# Patient Record
Sex: Male | Born: 1945 | Race: White | Hispanic: No | Marital: Married | Smoking: Never smoker
Health system: Southern US, Community
[De-identification: ages and names within clinical notes are randomized; demographics above are authoritative.]

---

## 2017-05-11 ENCOUNTER — Other Ambulatory Visit: Payer: Self-pay | Admitting: Chiropractor

## 2017-05-11 DIAGNOSIS — M5412 Radiculopathy, cervical region: Secondary | ICD-10-CM

## 2017-05-21 ENCOUNTER — Ambulatory Visit
Admission: RE | Admit: 2017-05-21 | Discharge: 2017-05-21 | Disposition: A | Discharge status: Home / Self Care | Payer: BLUE CROSS/BLUE SHIELD | Source: Ambulatory Visit | Attending: Chiropractor | Admitting: Chiropractor

## 2017-05-21 DIAGNOSIS — M5412 Radiculopathy, cervical region: Secondary | ICD-10-CM

## 2018-10-27 IMAGING — MR MR CERVICAL SPINE W/O CM
4 of 5 series · 23 of 48 positions shown · non-contrast
Comparison: None.

CLINICAL DATA: Neck pain radiating to LEFT arm and LEFT thumb
numbness after fall 3 months ago. Remote history of surgery. Assess
radiculopathy.

EXAM:
MRI CERVICAL SPINE WITHOUT CONTRAST
TECHNIQUE: Multiplanar, multisequence MR imaging of the cervical spine was
performed. No intravenous contrast was administered.

[Series 4: T2 · sagittal · 3.3mm · 0.43mm/px · 6 of 14 slices shown (1 of 2)]
[im 1/14]
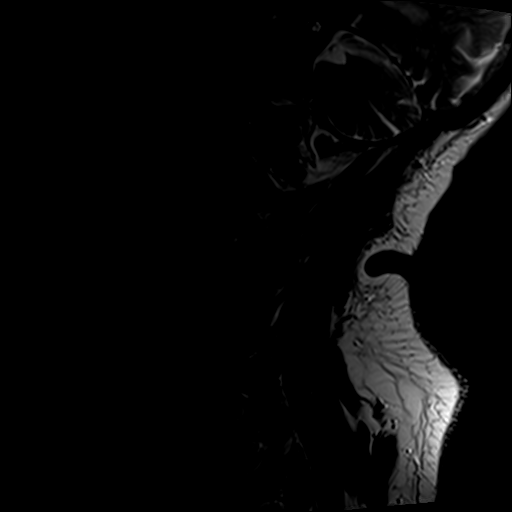
[im 3/14]
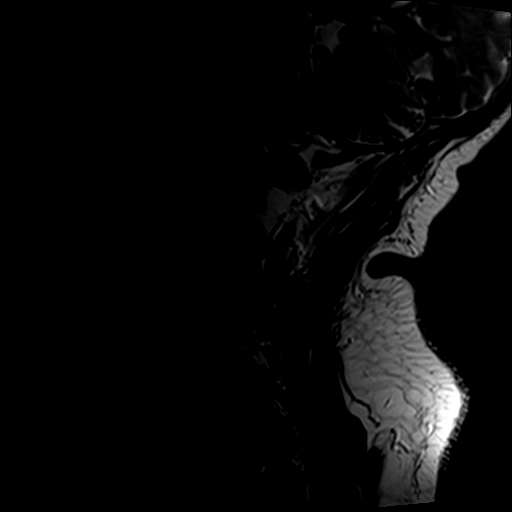
[im 6/14]
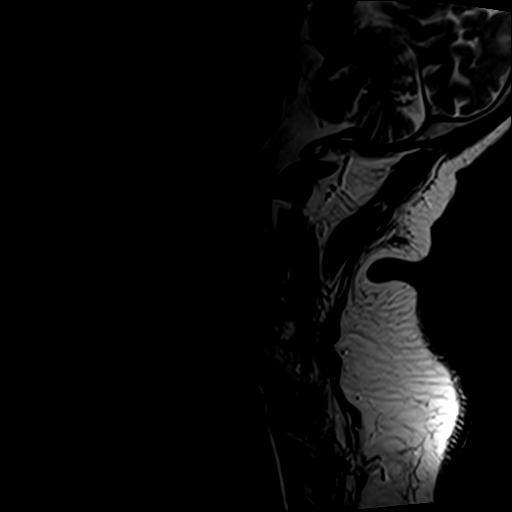
[im 8/14]
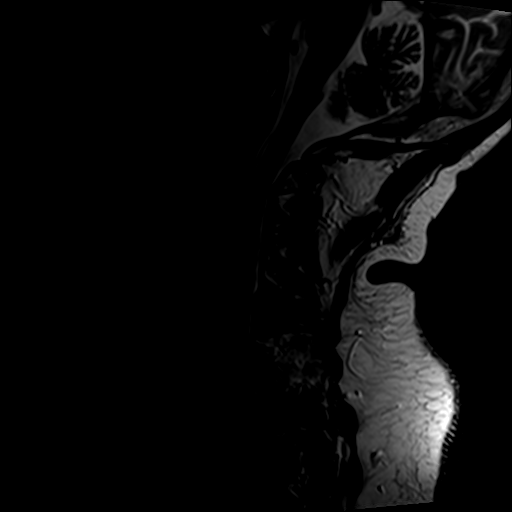
[im 11/14]
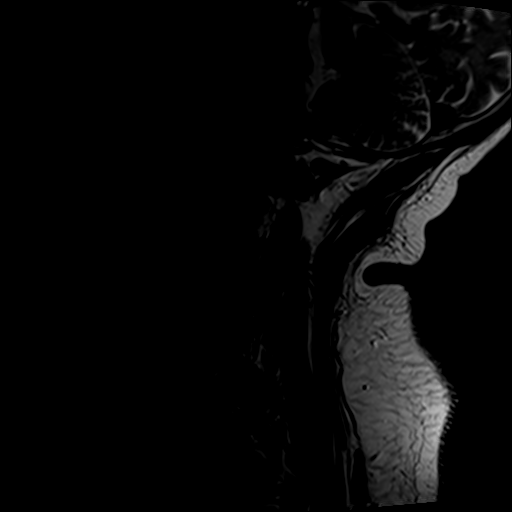
[im 14/14]
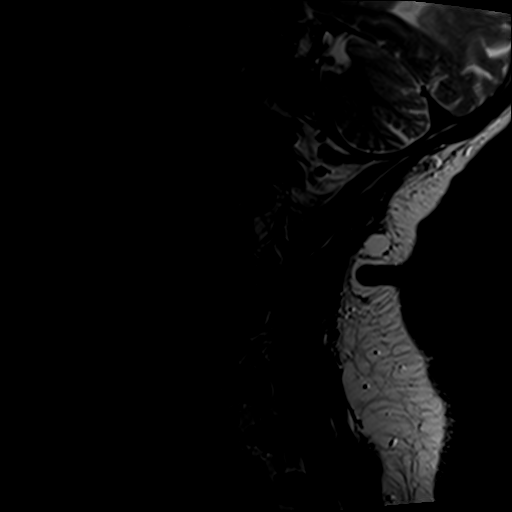

[Series 5: T1 · sagittal · 3.3mm · 0.43mm/px · 6 of 14 slices shown]
[im 1/14]
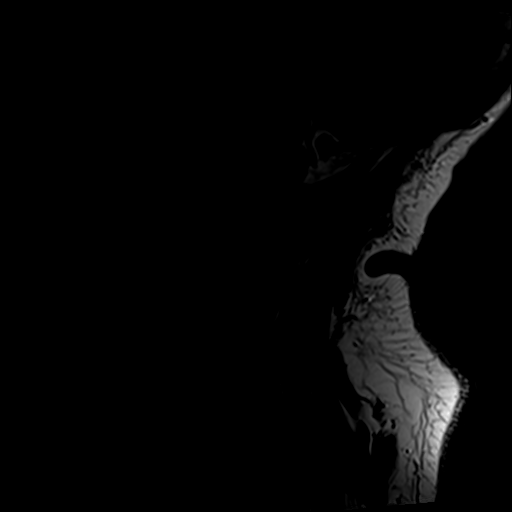
[im 3/14]
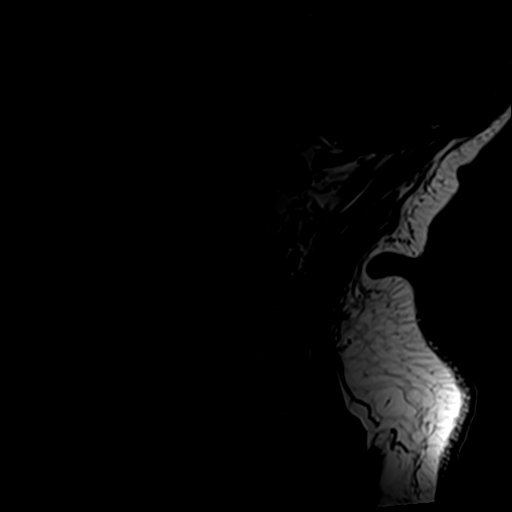
[im 5/14]
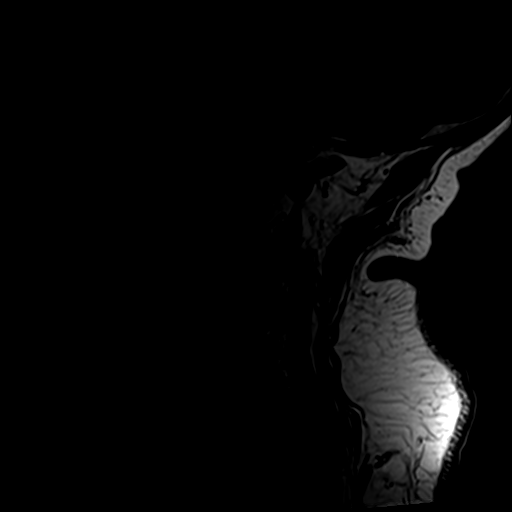
[im 7/14]
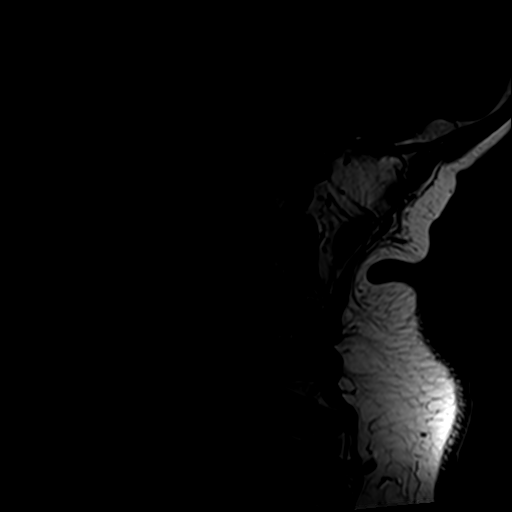
[im 9/14]
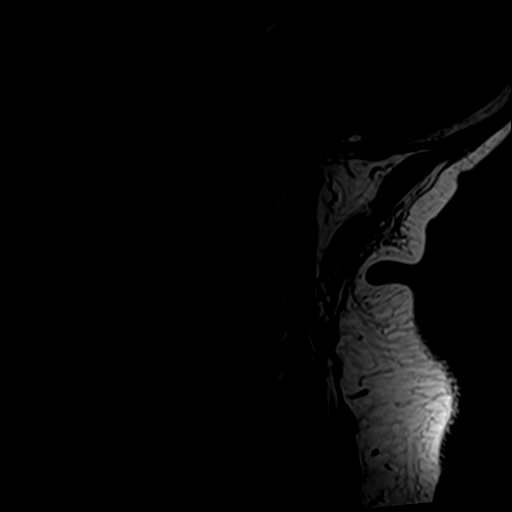
[im 11/14]
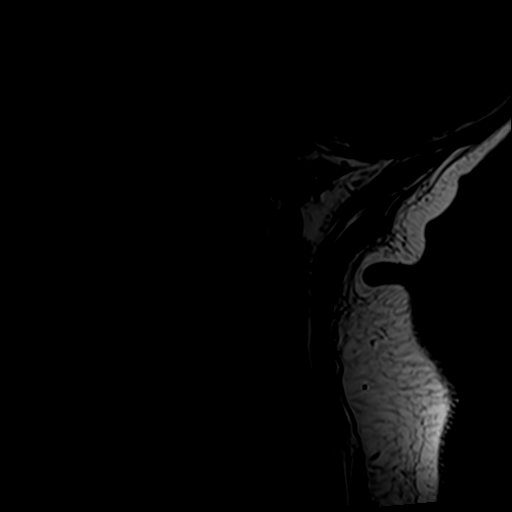

[Series 6: tir sag · sagittal · 3.3mm · 0.43mm/px · 3 of 14 slices shown]
[im 3/14]
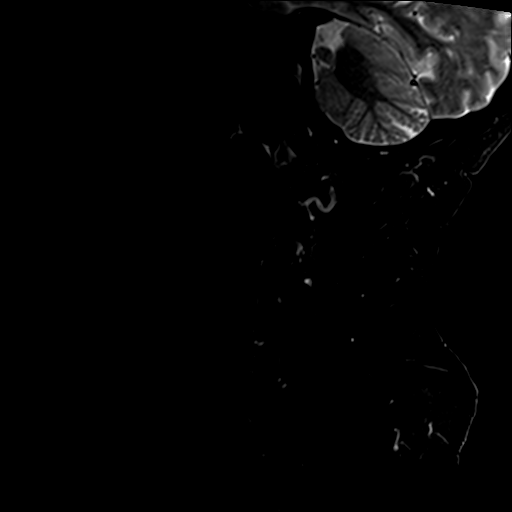
[im 7/14]
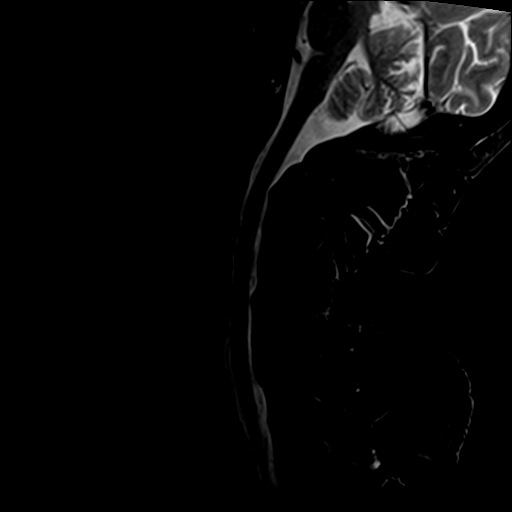
[im 11/14]
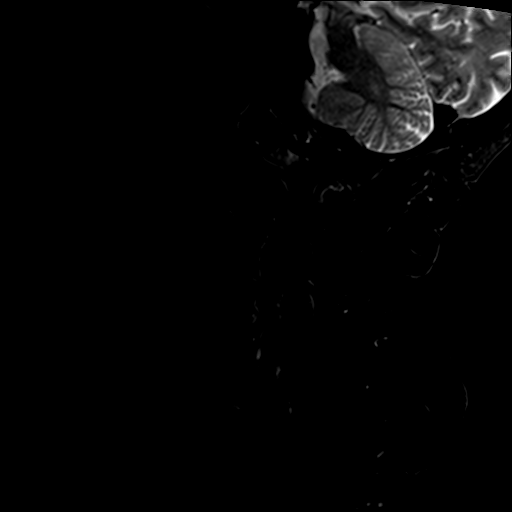

[Series 9: T2 · axial · 3.0mm · 0.70mm/px · z∈[-114,-20]mm · 8 of 28 slices shown (2 of 2)]
[im 1/28]
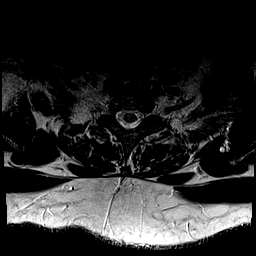
[im 5/28]
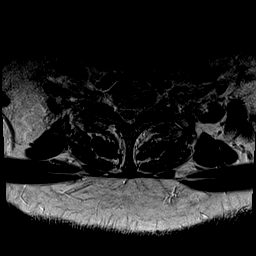
[im 9/28]
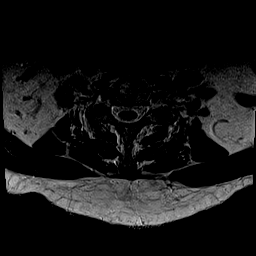
[im 13/28]
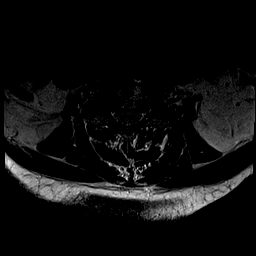
[im 15/28]
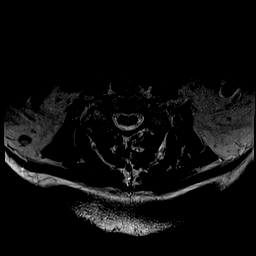
[im 19/28]
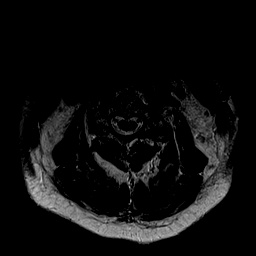
[im 23/28]
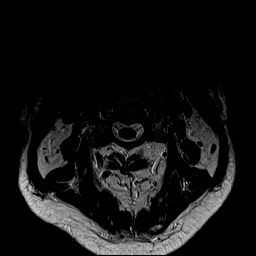
[im 28/28]
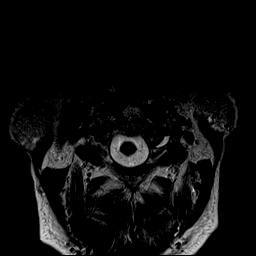

[23 of 48 positions shown; findings below may reference images not displayed]

FINDINGS: ALIGNMENT: Maintained cervical lordosis. Grade 1 C7-T1
anterolisthesis.

VERTEBRAE/DISCS: Solid C6-7 arthrodesis. Cervical vertebral bodies
intact. Moderate to severe C5-6 disc height loss with disc
desiccation and moderate chronic discogenic endplate changes
compatible with degenerative disc. Mild C7-T1 degenerative disc.
Mild low T1 and bright STIR signal bilateral C7-T1 facets compatible
with bone marrow edema and to lesser extent on the RIGHT C5-6 facet.

CORD:Cervical spinal cord is normal morphology and signal
characteristics from the cervicomedullary junction to level of T2-3,
the most caudal well visualized level.

POSTERIOR FOSSA, VERTEBRAL ARTERIES, PARASPINAL TISSUES: No MR
findings of ligamentous injury. Vertebral artery flow voids present.
Included posterior fossa and paraspinal soft tissues are normal.

DISC LEVELS:

C2-3: Small central disc protrusion. Severe RIGHT and mild LEFT
facet arthropathy. No canal stenosis. Moderate to severe RIGHT
neural foraminal narrowing.

C3-4: Small broad-based disc bulge and central disc protrusion
deforming the ventral spinal cord. Uncovertebral hypertrophy.
Moderate RIGHT and severe LEFT facet arthropathy. Mild canal
stenosis. Mild RIGHT. Moderate to severe LEFT neural foraminal
narrowing.

C4-5: Small central disc protrusion, uncovertebral hypertrophy.
Severe RIGHT and mild LEFT facet arthropathy. No canal stenosis.
Moderate to severe RIGHT neural foraminal narrowing.

C5-6: Small broad-based disc bulge, uncovertebral hypertrophy and
moderate facet arthropathy. Mild canal stenosis. Severe RIGHT,
moderate to severe LEFT neural foraminal narrowing.

C6-7: No disc bulge, canal stenosis nor neural foraminal narrowing.

C7-T1: Anterolisthesis. Annular bulging. Severe facet arthropathy.
Mild canal stenosis. Mild RIGHT, moderate LEFT neural foraminal
narrowing.
IMPRESSION: 1. C6-7 arthrodesis with findings of adjacent segment disease. Grade
1 C7-T1 anterolisthesis.
2. Degenerative change of the cervical spine including multilevel
advanced facet arthropathy with acute C7-T1 and to lesser extent
C5-6 reactive changes.
3. Mild canal stenosis C3-4, C5-6 and C7-T1. Multilevel neural
foraminal narrowing: Severe on the RIGHT at C5-6.

## 2022-05-12 ENCOUNTER — Emergency Department (HOSPITAL_COMMUNITY): Payer: BLUE CROSS/BLUE SHIELD

## 2022-05-12 ENCOUNTER — Other Ambulatory Visit: Payer: Self-pay

## 2022-05-12 ENCOUNTER — Observation Stay (HOSPITAL_COMMUNITY)
Admission: EM | Admit: 2022-05-12 | Discharge: 2022-05-13 | Disposition: A | Discharge status: Home / Self Care | Payer: BLUE CROSS/BLUE SHIELD | Attending: Internal Medicine | Admitting: Internal Medicine

## 2022-05-12 DIAGNOSIS — Z79624 Long term (current) use of inhibitors of nucleotide synthesis: Secondary | ICD-10-CM

## 2022-05-12 DIAGNOSIS — N400 Enlarged prostate without lower urinary tract symptoms: Secondary | ICD-10-CM | POA: Diagnosis present

## 2022-05-12 DIAGNOSIS — L309 Dermatitis, unspecified: Secondary | ICD-10-CM | POA: Diagnosis present

## 2022-05-12 DIAGNOSIS — G250 Essential tremor: Secondary | ICD-10-CM | POA: Diagnosis present

## 2022-05-12 DIAGNOSIS — Z7901 Long term (current) use of anticoagulants: Secondary | ICD-10-CM

## 2022-05-12 DIAGNOSIS — Z7985 Long-term (current) use of injectable non-insulin antidiabetic drugs: Secondary | ICD-10-CM

## 2022-05-12 DIAGNOSIS — I639 Cerebral infarction, unspecified: Principal | ICD-10-CM | POA: Insufficient documentation

## 2022-05-12 DIAGNOSIS — I6340 Cerebral infarction due to embolism of unspecified cerebral artery: Secondary | ICD-10-CM | POA: Diagnosis not present

## 2022-05-12 DIAGNOSIS — I482 Chronic atrial fibrillation, unspecified: Secondary | ICD-10-CM | POA: Diagnosis not present

## 2022-05-12 DIAGNOSIS — R29810 Facial weakness: Secondary | ICD-10-CM | POA: Diagnosis present

## 2022-05-12 DIAGNOSIS — Z880 Allergy status to penicillin: Secondary | ICD-10-CM

## 2022-05-12 DIAGNOSIS — E785 Hyperlipidemia, unspecified: Secondary | ICD-10-CM | POA: Insufficient documentation

## 2022-05-12 DIAGNOSIS — I10 Essential (primary) hypertension: Secondary | ICD-10-CM | POA: Insufficient documentation

## 2022-05-12 DIAGNOSIS — Z7982 Long term (current) use of aspirin: Secondary | ICD-10-CM

## 2022-05-12 DIAGNOSIS — R471 Dysarthria and anarthria: Secondary | ICD-10-CM | POA: Diagnosis present

## 2022-05-12 DIAGNOSIS — R29701 NIHSS score 1: Secondary | ICD-10-CM | POA: Diagnosis present

## 2022-05-12 DIAGNOSIS — E876 Hypokalemia: Secondary | ICD-10-CM | POA: Diagnosis present

## 2022-05-12 DIAGNOSIS — I160 Hypertensive urgency: Secondary | ICD-10-CM | POA: Diagnosis present

## 2022-05-12 DIAGNOSIS — E119 Type 2 diabetes mellitus without complications: Secondary | ICD-10-CM | POA: Insufficient documentation

## 2022-05-12 DIAGNOSIS — Z79899 Other long term (current) drug therapy: Secondary | ICD-10-CM

## 2022-05-12 LAB — CBC WITH DIFFERENTIAL/PLATELET
Abs Immature Granulocytes: 0.02 10*3/uL (ref 0.00–0.07)
Basophils Absolute: 0.1 10*3/uL (ref 0.0–0.1)
Basophils Relative: 1 %
Eosinophils Absolute: 0.2 10*3/uL (ref 0.0–0.5)
Eosinophils Relative: 3 %
HCT: 55 % — ABNORMAL HIGH (ref 39.0–52.0)
Hemoglobin: 18.2 g/dL — ABNORMAL HIGH (ref 13.0–17.0)
Immature Granulocytes: 0 %
Lymphocytes Relative: 24 %
Lymphs Abs: 2.1 10*3/uL (ref 0.7–4.0)
MCH: 30.4 pg (ref 26.0–34.0)
MCHC: 33.1 g/dL (ref 30.0–36.0)
MCV: 91.8 fL (ref 80.0–100.0)
Monocytes Absolute: 1 10*3/uL (ref 0.1–1.0)
Monocytes Relative: 11 %
Neutro Abs: 5.3 10*3/uL (ref 1.7–7.7)
Neutrophils Relative %: 61 %
Platelets: 269 10*3/uL (ref 150–400)
RBC: 5.99 MIL/uL — ABNORMAL HIGH (ref 4.22–5.81)
RDW: 12.4 % (ref 11.5–15.5)
WBC: 8.7 10*3/uL (ref 4.0–10.5)
nRBC: 0 % (ref 0.0–0.2)

## 2022-05-12 LAB — BASIC METABOLIC PANEL
Anion gap: 13 (ref 5–15)
BUN: 18 mg/dL (ref 8–23)
CO2: 30 mmol/L (ref 22–32)
Calcium: 9.4 mg/dL (ref 8.9–10.3)
Chloride: 95 mmol/L — ABNORMAL LOW (ref 98–111)
Creatinine, Ser: 1.11 mg/dL (ref 0.61–1.24)
GFR, Estimated: >60 mL/min (ref >60)
Glucose, Bld: 85 mg/dL (ref 70–99)
Potassium: 3.3 mmol/L — ABNORMAL LOW (ref 3.5–5.1)
Sodium: 138 mmol/L (ref 135–145)

## 2022-05-12 LAB — TROPONIN I (HIGH SENSITIVITY)
Troponin I (High Sensitivity): 14 ng/L (ref <18)
Troponin I (High Sensitivity): 14 ng/L (ref <18)

## 2022-05-12 LAB — CBG MONITORING, ED: Glucose-Capillary: 86 mg/dL (ref 70–99)

## 2022-05-12 NOTE — ED Provider Triage Note (Signed)
Emergency Medicine Provider Triage Evaluation Note  Ricky Juarez , a 77 y.o. male  was evaluated in triage.  Pt complains of facial droop.  Wife states symptoms began yesterday and that her husband started to veer to the right when he walks and is decree strength on his right side along with a facial droop.  Patient is on Eliquis.  Patient states had a headache for the past day.  Patient denied chest pain, shortness of breath, abdominal pain, nausea/vomiting, fevers, chills, dysuria  Review of Systems  Positive: See HPI Negative: See HPI  Physical Exam  There were no vitals taken for this visit. Gen:   Awake, no distress   Resp:  Normal effort  MSK:   Moves extremities without difficulty  Other:  Slightly asymmetric smile which wife says is new, 5 out of 5 bilateral grip/dorsiflexion, 2+ bilateral radial/posterior tibialis pulses with regular rate, sensation tact in all 4 limbs, patient able to blink  Medical Decision Making  Medically screening exam initiated at 6:25 PM.  Appropriate orders placed.  Ricky Juarez was informed that the remainder of the evaluation will be completed by another provider, this initial triage assessment does not replace that evaluation, and the importance of remaining in the ED until their evaluation is complete.  Workup initiated, patient stable at this time, patient's last known normal would be outside of tPA window and he is on Eliquis   Ricky Juarez 05/12/22 1835

## 2022-05-12 NOTE — ED Provider Notes (Signed)
[facility] Provider Note   CSN: [id number] Arrival date & time: 05/12/22  1823     History  Chief Complaint  Patient presents with   Facial Droop    Ricky Juarez is a 77 y.o. male.  Patient yesterday morning awoke at 8 in the morning had gone to bed at 10 PM the day before at 8 in the morning he noticed that he was offkilter and things were kind of leaning to the right side.  There was questionable facial droop.  Patient today has definite facial droop and dysarthria.  Yesterday patient felt that the leaning corrected itself but then it was back again today.  Denies any arm weakness or leg weakness or any numbness no visual changes.  But he felt that his speech was slurred today.  That seems to be improving but there seems to be a facial droop on the right side still.  Past medical history is significant for atrial fibrillation and patient is on Eliquis for that.  No prior history of stroke.  Past medical history sniffing for hypertension hyperlipidemia type 2 diabetes as well as the atrial fibrillation.  [facility]  Patient is never used tobacco products.       Home Medications Prior to Admission medications   Not on File      Allergies    Patient has no known allergies.    Review of Systems   Review of Systems  Constitutional:  Negative for chills and fever.  HENT:  Negative for ear pain and sore throat.   Eyes:  Negative for pain and visual disturbance.  Respiratory:  Negative for cough and shortness of breath.   Cardiovascular:  Negative for chest pain and palpitations.  Gastrointestinal:  Negative for abdominal pain and vomiting.  Genitourinary:  Negative for dysuria and hematuria.  Musculoskeletal:  Negative for arthralgias and back pain.  Skin:  Negative for color change and rash.  Neurological:  Positive for facial asymmetry and speech difficulty. Negative for seizures, syncope and headaches.  All other  systems reviewed and are negative.   Physical Exam Updated Vital Signs BP (!) 196/120   Pulse (!) 103   Temp 98 F (36.7 C) (Oral)   Resp (!) 26   SpO2 97%  Physical Exam Vitals and nursing note reviewed.  Constitutional:      General: He is not in acute distress.    Appearance: Normal appearance. He is well-developed.  HENT:     Head: Normocephalic and atraumatic.  Eyes:     Extraocular Movements: Extraocular movements intact.     Conjunctiva/sclera: Conjunctivae normal.     Pupils: Pupils are equal, round, and reactive to light.  Cardiovascular:     Rate and Rhythm: Normal rate and regular rhythm.     Heart sounds: No murmur heard. Pulmonary:     Effort: Pulmonary effort is normal. No respiratory distress.     Breath sounds: Normal breath sounds.  Abdominal:     Palpations: Abdomen is soft.     Tenderness: There is no abdominal tenderness.  Musculoskeletal:        General: No swelling.     Cervical back: Normal range of motion and neck supple.  Skin:    General: Skin is warm and dry.     Capillary Refill: Capillary refill takes less than 2 seconds.  Neurological:     Mental Status: He is alert.     Cranial Nerves: Cranial nerve  deficit present.     Sensory: No sensory deficit.     Motor: No weakness.     Coordination: Coordination normal.     Comments: Gait not tested.  Patient has right facial droop.  Psychiatric:        Mood and Affect: Mood normal.     ED Results / Procedures / Treatments   Labs (all labs ordered are listed, but only abnormal results are displayed) Labs Reviewed  BASIC METABOLIC PANEL - Abnormal; Notable for the following components:      Result Value   Potassium 3.3 (*)    Chloride 95 (*)    All other components within normal limits  CBC WITH DIFFERENTIAL/PLATELET - Abnormal; Notable for the following components:   RBC 5.99 (*)    Hemoglobin 18.2 (*)    HCT 55.0 (*)    All other components within normal limits  CBG MONITORING, ED   TROPONIN I (HIGH SENSITIVITY)  TROPONIN I (HIGH SENSITIVITY)    EKG EKG Interpretation  Date/Time:  Monday May 12 2022 19:26:52 EDT Ventricular Rate:  92 PR Interval:    QRS Duration: 105 QT Interval:  405 QTC Calculation: 502 R Axis:   41 Text Interpretation: Atrial fibrillation Ventricular premature complex Borderline repolarization abnormality Prolonged QT interval No previous ECGs available Confirmed by Fredia Sorrow [phone]) on 05/12/2022 7:35:40 PM  Radiology CT Head Wo Contrast  Result Date: 05/12/2022 CLINICAL DATA:  Right facial droop, weakness EXAM: CT HEAD WITHOUT CONTRAST TECHNIQUE: Contiguous axial images were obtained from the base of the skull through the vertex without intravenous contrast. RADIATION DOSE REDUCTION: This exam was performed according to the departmental dose-optimization program which includes automated exposure control, adjustment of the mA and/or kV according to patient size and/or use of iterative reconstruction technique. COMPARISON:  None Available. FINDINGS: Brain: Normal anatomic configuration. Parenchymal volume loss is commensurate with the patient's age. Mild periventricular white matter changes are present likely reflecting the sequela of small vessel ischemia. No abnormal intra or extra-axial mass lesion or fluid collection. No abnormal mass effect or midline shift. No evidence of acute intracranial hemorrhage or infarct. Ventricular size is normal. Cerebellum unremarkable. Vascular: No asymmetric hyperdense vasculature at the skull base. Skull: Intact Sinuses/Orbits: Paranasal sinuses are clear. Orbits are unremarkable. Other: Mastoid air cells and middle ear cavities are clear. IMPRESSION: 1. No acute intracranial hemorrhage or infarct. 2. Mild senescent change. Electronically Signed   By: Fidela Salisbury M.D.   On: 05/12/2022 19:46    Procedures Procedures    Medications Ordered in ED Medications - No data to display  ED Course/ Medical  Decision Making/ A&P                             Medical Decision Making Amount and/or Complexity of Data Reviewed Radiology: ordered.  Risk Decision regarding hospitalization.   CRITICAL CARE Performed by: Fredia Sorrow Total critical care time: 45 minutes Critical care time was exclusive of separately billable procedures and treating other patients. Critical care was necessary to treat or prevent imminent or life-threatening deterioration. Critical care was time spent personally by me on the following activities: development of treatment plan with patient and/or surrogate as well as nursing, discussions with consultants, evaluation of patient's response to treatment, examination of patient, obtaining history from patient or surrogate, ordering and performing treatments and interventions, ordering and review of laboratory studies, ordering and review of radiographic studies, pulse oximetry and re-evaluation of  patient's condition.  Discussed with Dr. Leonel Ramsay.  CT head without acute findings.  Patient still seems to have some facial droop but he thinks this may just be more TIA and since this started actually almost 2 days ago may not require admission.  He wants to do CTA angio head and neck and MRI brain and if those are all very normal then patient could be discharged home would recommend contacting him once results are back.  Labs here troponin 14 blood sugar 86.  Basic metabolic panel significant for potassium 3.3's with some mild hypokalemia.  Renal function normal.  CBC white count 8.7 hemoglobin 18.2 platelets are normal at 269.  CT head does mention no acute findings.    Final Clinical Impression(s) / ED Diagnoses Final diagnoses:  Cerebrovascular accident (CVA), unspecified mechanism [facility])    Rx / DC Orders ED Discharge Orders     None         Fredia Sorrow, MD 05/12/22 2329

## 2022-05-12 NOTE — ED Triage Notes (Signed)
Patient here with complaint of facial droop and dysarthria that started yesterday per wife. Patient states he thinks the symptoms may be related to starting a beta blocker two weeks ago. No arm or leg drift, no sensation loss.

## 2022-05-12 NOTE — ED Notes (Signed)
Informed RN [facility] pt monitor indicated AFIB (non lethal)

## 2022-05-12 NOTE — ED Notes (Signed)
Patient transported to CT 

## 2022-05-13 ENCOUNTER — Encounter (HOSPITAL_COMMUNITY): Payer: Self-pay | Admitting: Internal Medicine

## 2022-05-13 ENCOUNTER — Other Ambulatory Visit (HOSPITAL_COMMUNITY): Payer: Self-pay

## 2022-05-13 ENCOUNTER — Inpatient Hospital Stay (HOSPITAL_COMMUNITY): Payer: BLUE CROSS/BLUE SHIELD

## 2022-05-13 ENCOUNTER — Other Ambulatory Visit: Payer: Self-pay

## 2022-05-13 DIAGNOSIS — I482 Chronic atrial fibrillation, unspecified: Secondary | ICD-10-CM | POA: Diagnosis present

## 2022-05-13 DIAGNOSIS — I160 Hypertensive urgency: Secondary | ICD-10-CM | POA: Diagnosis present

## 2022-05-13 DIAGNOSIS — I480 Paroxysmal atrial fibrillation: Secondary | ICD-10-CM

## 2022-05-13 DIAGNOSIS — E785 Hyperlipidemia, unspecified: Secondary | ICD-10-CM | POA: Diagnosis present

## 2022-05-13 DIAGNOSIS — I6389 Other cerebral infarction: Secondary | ICD-10-CM | POA: Diagnosis not present

## 2022-05-13 DIAGNOSIS — Z79624 Long term (current) use of inhibitors of nucleotide synthesis: Secondary | ICD-10-CM | POA: Diagnosis not present

## 2022-05-13 DIAGNOSIS — E119 Type 2 diabetes mellitus without complications: Secondary | ICD-10-CM | POA: Diagnosis present

## 2022-05-13 DIAGNOSIS — R29701 NIHSS score 1: Secondary | ICD-10-CM | POA: Diagnosis present

## 2022-05-13 DIAGNOSIS — Z7901 Long term (current) use of anticoagulants: Secondary | ICD-10-CM | POA: Diagnosis not present

## 2022-05-13 DIAGNOSIS — I639 Cerebral infarction, unspecified: Secondary | ICD-10-CM | POA: Diagnosis not present

## 2022-05-13 DIAGNOSIS — Z79899 Other long term (current) drug therapy: Secondary | ICD-10-CM | POA: Diagnosis not present

## 2022-05-13 DIAGNOSIS — Z880 Allergy status to penicillin: Secondary | ICD-10-CM | POA: Diagnosis not present

## 2022-05-13 DIAGNOSIS — G250 Essential tremor: Secondary | ICD-10-CM | POA: Diagnosis present

## 2022-05-13 DIAGNOSIS — Z7982 Long term (current) use of aspirin: Secondary | ICD-10-CM | POA: Diagnosis not present

## 2022-05-13 DIAGNOSIS — E876 Hypokalemia: Secondary | ICD-10-CM | POA: Diagnosis present

## 2022-05-13 DIAGNOSIS — I10 Essential (primary) hypertension: Secondary | ICD-10-CM | POA: Diagnosis present

## 2022-05-13 DIAGNOSIS — I6340 Cerebral infarction due to embolism of unspecified cerebral artery: Secondary | ICD-10-CM | POA: Diagnosis present

## 2022-05-13 DIAGNOSIS — R471 Dysarthria and anarthria: Secondary | ICD-10-CM | POA: Diagnosis present

## 2022-05-13 DIAGNOSIS — Z7985 Long-term (current) use of injectable non-insulin antidiabetic drugs: Secondary | ICD-10-CM | POA: Diagnosis not present

## 2022-05-13 DIAGNOSIS — R29810 Facial weakness: Secondary | ICD-10-CM | POA: Diagnosis present

## 2022-05-13 DIAGNOSIS — L309 Dermatitis, unspecified: Secondary | ICD-10-CM | POA: Diagnosis present

## 2022-05-13 DIAGNOSIS — N400 Enlarged prostate without lower urinary tract symptoms: Secondary | ICD-10-CM | POA: Diagnosis present

## 2022-05-13 LAB — LIPID PANEL
Cholesterol: 175 mg/dL (ref 0–200)
HDL: 54 mg/dL (ref >40)
LDL Cholesterol: 111 mg/dL — ABNORMAL HIGH (ref 0–99)
Total CHOL/HDL Ratio: 3.2 RATIO
Triglycerides: 49 mg/dL (ref <150)
VLDL: 10 mg/dL (ref 0–40)

## 2022-05-13 LAB — ECHOCARDIOGRAM COMPLETE BUBBLE STUDY
AR max vel: 2.85 cm2
AV Area VTI: 2.48 cm2
AV Area mean vel: 2.53 cm2
AV Mean grad: 3 mmHg
AV Peak grad: 5.4 mmHg
Ao pk vel: 1.17 m/s

## 2022-05-13 LAB — HEMOGLOBIN A1C
Hgb A1c MFr Bld: 6.2 % — ABNORMAL HIGH (ref 4.8–5.6)
Mean Plasma Glucose: 131 mg/dL

## 2022-05-13 MED ORDER — ATORVASTATIN CALCIUM 10 MG PO TABS: 20.0000 mg | ORAL_TABLET | Freq: Every day | ORAL | Status: DC

## 2022-05-13 MED ORDER — ATORVASTATIN CALCIUM 10 MG PO TABS
10.0000 mg | ORAL_TABLET | Freq: Every day | ORAL | Status: DC
  Administered 2022-05-13: 10 mg via ORAL
  Filled 2022-05-13: qty 1

## 2022-05-13 MED ORDER — ATORVASTATIN CALCIUM 40 MG PO TABS
40.0000 mg | ORAL_TABLET | Freq: Every day | ORAL | 3 refills | Status: AC
  Filled 2022-05-13: qty 30, 30d supply, fill #0

## 2022-05-13 MED ORDER — TORSEMIDE 20 MG PO TABS: 10.0000 mg | ORAL_TABLET | Freq: Every day | ORAL | Status: DC | PRN

## 2022-05-13 MED ORDER — IOHEXOL 350 MG/ML SOLN
75.0000 mL | Freq: Once | INTRAVENOUS | Status: AC | PRN
  Administered 2022-05-13: 75 mL via INTRAVENOUS

## 2022-05-13 MED ORDER — DILTIAZEM HCL ER COATED BEADS 180 MG PO CP24
360.0000 mg | ORAL_CAPSULE | Freq: Every day | ORAL | Status: DC
  Administered 2022-05-13: 360 mg via ORAL
  Filled 2022-05-13: qty 2

## 2022-05-13 MED ORDER — TAMSULOSIN HCL 0.4 MG PO CAPS: 0.4000 mg | ORAL_CAPSULE | Freq: Every evening | ORAL | Status: DC

## 2022-05-13 MED ORDER — STROKE: EARLY STAGES OF RECOVERY BOOK: Freq: Once | Status: DC

## 2022-05-13 MED ORDER — SENNOSIDES-DOCUSATE SODIUM 8.6-50 MG PO TABS: 1.0000 | ORAL_TABLET | Freq: Every evening | ORAL | Status: DC | PRN

## 2022-05-13 MED ORDER — ASPIRIN 81 MG PO TBEC
81.0000 mg | DELAYED_RELEASE_TABLET | Freq: Every day | ORAL | 12 refills | Status: AC
  Filled 2022-05-13: qty 30, 30d supply, fill #0

## 2022-05-13 MED ORDER — APIXABAN 5 MG PO TABS: 5.0000 mg | ORAL_TABLET | Freq: Two times a day (BID) | ORAL | Status: DC

## 2022-05-13 MED ORDER — ACETAMINOPHEN 325 MG PO TABS: 650.0000 mg | ORAL_TABLET | ORAL | Status: DC | PRN

## 2022-05-13 MED ORDER — APIXABAN 5 MG PO TABS
5.0000 mg | ORAL_TABLET | Freq: Two times a day (BID) | ORAL | Status: DC
  Administered 2022-05-13: 5 mg via ORAL
  Filled 2022-05-13: qty 1

## 2022-05-13 MED ORDER — ACETAMINOPHEN 650 MG RE SUPP: 650.0000 mg | RECTAL | Status: DC | PRN

## 2022-05-13 MED ORDER — SODIUM CHLORIDE 0.9 % IV SOLN: INTRAVENOUS | Status: DC

## 2022-05-13 MED ORDER — ACETAMINOPHEN 160 MG/5ML PO SOLN: 650.0000 mg | ORAL | Status: DC | PRN

## 2022-05-13 MED ORDER — METOPROLOL SUCCINATE ER 25 MG PO TB24
25.0000 mg | ORAL_TABLET | Freq: Every day | ORAL | Status: DC
  Filled 2022-05-13: qty 1

## 2022-05-13 MED ORDER — ASPIRIN 81 MG PO TBEC
81.0000 mg | DELAYED_RELEASE_TABLET | Freq: Every day | ORAL | Status: DC
  Administered 2022-05-13: 81 mg via ORAL
  Filled 2022-05-13: qty 1

## 2022-05-13 MED ORDER — ATORVASTATIN CALCIUM 40 MG PO TABS: 40.0000 mg | ORAL_TABLET | Freq: Every day | ORAL | Status: DC

## 2022-05-13 NOTE — H&P (Signed)
History and Physical    Ricky Juarez [id number] DOB: [birth year] DOA: 05/12/2022  PCP: Nickola Major, MD  Patient coming from: home  I have personally briefly reviewed patient's old medical records in [place]  Chief Complaint:  slurred speech and weakness  HPI: Ricky Juarez is a 77 y.o. male with medical history significant of   Hypertension, DMII, Vit D def,  Atrial  fib s/p 2 trials at ablation , OSA , BPH ,  essential tremor HLD who presents with  facial droop on the right and slurred speech  and feeling off balanced which started 24 hour prior to presentation. Patient currently states he presented to  ED at urging of wife and son as is symptoms returned from day prior and now was having difficulty with his speech and facial droop.He notes symptoms have resolved. He notes no HA, presyncope, no prior episodes like this in the past.  ED Course:  Vitals: afeb, bp 192/126,  hr 16 sat 99%  Labs wbc 8.7, hgb 18.2 , plt 269  N a138, K 3.3 , cr 1.1, ce 14  CTHMPRESSION: 1. No acute intracranial hemorrhage or infarct. 2. Mild senescent change.   MRI MPRESSION: 1. 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 2. Underlying age-related cerebral atrophy with moderate chronic microvascular ischemic disease.  CTA neck : Neg for LVO Cxr NAD Review of Systems: As per HPI otherwise 10 point review of systems negative.   PMH As noted above     SH  No tobacco  No etoh  quit 55mo ago  Allergies  Allergen Reactions   Penicillin G Anaphylaxis and Rash    No family history on file.  Prior to Admission medications   Medication Sig Start Date End Date Taking? Authorizing Provider  atorvastatin (LIPITOR) 10 MG tablet Take 10 mg by mouth daily. 02/02/22  Yes [provider]  clobetasol cream (TEMOVATE) [id number] % Apply 1 Application topically daily as needed (for rash). 04/02/22  Yes [provider]  desonide (DESOWEN)  0.05 % cream Apply 1 Application topically daily as needed (for rash). 04/02/22  Yes [provider]  diltiazem (CARDIZEM CD) 360 MG 24 hr capsule Take 360 mg by mouth daily. 02/02/22  Yes [provider]  ELIQUIS 5 MG TABS tablet Take 5 mg by mouth 2 (two) times daily. 10/29/21  Yes [provider]  losartan-hydrochlorothiazide (HYZAAR) 100-25 MG tablet Take 1 tablet by mouth daily. 04/02/22  Yes [provider]  metoprolol succinate (TOPROL-XL) 25 MG 24 hr tablet Take 25 mg by mouth daily. 04/28/22 04/28/23 Yes [provider]  MOUNJARO 2.5 MG/0.5ML Pen Inject 2.5 mg into the skin once a week. Then increase to 5mg  weekly (new separate prescription sent) - Saturday 04/30/22  Yes [provider]  mycophenolate (CELLCEPT) 250 MG capsule Take 250 mg by mouth in the morning, at noon, and at bedtime. For rash   Yes [provider]  primidone (MYSOLINE) 50 MG tablet Take 50-100 mg by mouth See admin instructions. Take 1 tablet by mouth 1 night and alternate with 2 tablets the next night 02/10/22  Yes [provider]  tamsulosin (FLOMAX) 0.4 MG CAPS capsule Take 0.4 mg by mouth every evening. 11/23/21  Yes [provider]  torsemide (DEMADEX) 10 MG tablet Take 10 mg by mouth daily as needed (for swelling). 03/25/22  Yes [provider]  diltiazem (CARDIZEM) 30 MG tablet Take 30 mg by mouth daily as needed (  for BP >170). Patient not taking: Reported on 05/12/2022 01/07/21   [provider]    Physical Exam: Vitals:   05/13/22 0130 05/13/22 0139 05/13/22 0322 05/13/22 0330  BP: (!) 178/100  (!) 188/126 (!) 182/94  Pulse: 87  87 86  Resp: (!) 23  18 19   Temp:  98 F (36.7 C)    TempSrc:  Oral    SpO2: 94%  95% 96%    Constitutional: NAD, calm, comfortable Vitals:   05/13/22 0130 05/13/22 0139 05/13/22 0322 05/13/22 0330  BP: (!) 178/100  (!) 188/126 (!) 182/94  Pulse: 87  87 86  Resp: (!) 23  18 19   Temp:  98  F (36.7 C)    TempSrc:  Oral    SpO2: 94%  95% 96%   Eyes: PERRL, lids and conjunctivae normal ENMT: Mucous membranes are moist. Posterior pharynx clear of any exudate or lesions.Normal dentition.  Neck: normal, supple, no masses, no thyromegaly Respiratory: clear to auscultation bilaterally, no wheezing, no crackles. Normal respiratory effort. No accessory muscle use.  Cardiovascular: Regular rate and rhythm, no murmurs / rubs / gallops. No extremity edema. 2+ pedal pulses. No carotid bruits.  Abdomen: no tenderness, no masses palpated. No hepatosplenomegaly. Bowel sounds positive.  Musculoskeletal: no clubbing / cyanosis. No joint deformity upper and lower extremities. Good ROM, no contractures. Normal muscle tone.  Skin: no rashes, lesions, ulcers. No induration Neurologic: CN 2-12 grossly intact except for mild facial drop on right Sensation intact,  Strength 5/5 in all 4.  Psychiatric: Normal judgment and insight. Alert and oriented x 3. Normal mood.    Labs on Admission: I have personally reviewed following labs and imaging studies  CBC: Recent Labs  Lab 05/12/22 1844  WBC 8.7  NEUTROABS 5.3  HGB 18.2*  HCT 55.0*  MCV 91.8  PLT [id number]   Basic Metabolic Panel: Recent Labs  Lab 05/12/22 1844  NA 138  K 3.3*  CL 95*  CO2 30  GLUCOSE 85  BUN 18  CREATININE 1.11  CALCIUM 9.4   GFR: CrCl cannot be calculated (Unknown ideal weight.). Liver Function Tests: No results for input(s): "AST", "ALT", "ALKPHOS", "BILITOT", "PROT", "ALBUMIN" in the last 168 hours. No results for input(s): "LIPASE", "AMYLASE" in the last 168 hours. No results for input(s): "AMMONIA" in the last 168 hours. Coagulation Profile: No results for input(s): "INR", "PROTIME" in the last 168 hours. Cardiac Enzymes: No results for input(s): "CKTOTAL", "CKMB", "CKMBINDEX", "TROPONINI" in the last 168 hours. BNP (last 3 results) No results for input(s): "PROBNP" in the last 8760 hours. HbA1C: No  results for input(s): "HGBA1C" in the last 72 hours. CBG: Recent Labs  Lab 05/12/22 1926  GLUCAP 86   Lipid Profile: No results for input(s): "CHOL", "HDL", "LDLCALC", "TRIG", "CHOLHDL", "LDLDIRECT" in the last 72 hours. Thyroid Function Tests: No results for input(s): "TSH", "T4TOTAL", "FREET4", "T3FREE", "THYROIDAB" in the last 72 hours. Anemia Panel: No results for input(s): "VITAMINB12", "FOLATE", "FERRITIN", "TIBC", "IRON", "RETICCTPCT" in the last 72 hours. Urine analysis: No results found for: "COLORURINE", "APPEARANCEUR", "LABSPEC", "PHURINE", "GLUCOSEU", "HGBUR", "BILIRUBINUR", "KETONESUR", "PROTEINUR", "UROBILINOGEN", "NITRITE", "LEUKOCYTESUR"  Radiological Exams on Admission: MR Brain Wo Contrast (neuro protocol)  Result Date: 05/13/2022 CLINICAL DATA:  Initial evaluation for neuro deficit, stroke. EXAM: MRI HEAD WITHOUT CONTRAST TECHNIQUE: Multiplanar, multiecho pulse sequences of the brain and surrounding structures were obtained without intravenous contrast. COMPARISON:  Prior CT from 05/12/2022. FINDINGS: Brain: Generalized age-related cerebral atrophy. Patchy T2/FLAIR hyperintensity involving the periventricular and  deep white matter both cerebral hemispheres, consistent with chronic small vessel ischemic disease, moderate in nature. Patchy small volume restricted diffusion involving the left basal ganglia/corona radiata, consistent with acute ischemic infarct (series 5, image 76). No associated hemorrhage or mass effect. No other evidence for acute or subacute ischemia. Gray-white matter differentiation otherwise maintained. No areas of chronic cortical infarction. No acute intracranial hemorrhage. Single punctate chronic microhemorrhage noted within the right cerebellum. No mass lesion, midline shift or mass effect. No hydrocephalus or extra-axial fluid collection. Pituitary gland and suprasellar region within normal limits. Vascular: Major intracranial vascular flow voids are  maintained. Skull and upper cervical spine: Craniocervical junction within normal limits. Bone marrow signal intensity normal. No scalp soft tissue abnormality. Sinuses/Orbits: Prior bilateral ocular lens replacement. Paranasal sinuses are largely clear. No mastoid effusion. Other: None. IMPRESSION: 1. 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 2. Underlying age-related cerebral atrophy with moderate chronic microvascular ischemic disease. Electronically Signed   By: Jeannine Boga M.D.   On: 05/13/2022 03:02   CT Head Wo Contrast  Result Date: 05/12/2022 CLINICAL DATA:  Right facial droop, weakness EXAM: CT HEAD WITHOUT CONTRAST TECHNIQUE: Contiguous axial images were obtained from the base of the skull through the vertex without intravenous contrast. RADIATION DOSE REDUCTION: This exam was performed according to the departmental dose-optimization program which includes automated exposure control, adjustment of the mA and/or kV according to patient size and/or use of iterative reconstruction technique. COMPARISON:  None Available. FINDINGS: Brain: Normal anatomic configuration. Parenchymal volume loss is commensurate with the patient's age. Mild periventricular white matter changes are present likely reflecting the sequela of small vessel ischemia. No abnormal intra or extra-axial mass lesion or fluid collection. No abnormal mass effect or midline shift. No evidence of acute intracranial hemorrhage or infarct. Ventricular size is normal. Cerebellum unremarkable. Vascular: No asymmetric hyperdense vasculature at the skull base. Skull: Intact Sinuses/Orbits: Paranasal sinuses are clear. Orbits are unremarkable. Other: Mastoid air cells and middle ear cavities are clear. IMPRESSION: 1. No acute intracranial hemorrhage or infarct. 2. Mild senescent change. Electronically Signed   By: Fidela Salisbury M.D.   On: 05/12/2022 19:46    EKG: Independently reviewed.  Atrial fib , pvc  Assessment/Plan  Acute CVA -admit cva r/o protocol -MRI brain + basal ganglia CVA -A1c/HLD pending -Permissive HTN x48 hours from sx onset  -goal BP < 220/110, PRN above these parameters - hold Eliquis  -asa 81 daily -neuro checks , SLP, PT/OT  -await final neuro recs    Hypertension, Uncontrolled  - 189/94 -premissive hypertension  -goal BP < 220/110, PRN above these parameters -resume in 24 hours from onset of event  Atrial  fib  -s/p 2 trials at ablation  now on rate control -on metoprolol/cardizem resume in 24 hours from  on set of event   DMII -A1c pending  -iss/fs    HLD Continue staitn  BPH -resume home regimen   Essential tremor -resume home regimen  DVT prophylaxis: heparin Code Status: full/ as discussed per patient wishes in event of cardiac arrest  Family Communication:  Westervelt,C S (Spouse) [phone] ([place])  Disposition Plan: patient  expected to be admitted greater than 2 midnights  Consults called:  Neurology Dr Leonel Ramsay  Admission status: progressive    Clance Boll MD [facility]   If 7PM-7AM, please contact night-coverage [email or web address] Password [facility]  05/13/2022, 3:58 AM

## 2022-05-13 NOTE — Consult Note (Signed)
Neurology Consultation Reason for Consult: Stroke Referring Physician: Marcello Moores, Chauncey Cruel  CC: Stroke  History is obtained from: Patient  HPI: Ricky Juarez is a 77 y.o. male who was in his normal state of health until Sunday morning.  At that time he had a transient episode where he felt like he was going to the right.  This resolved completely and he came back to his normal self.  Then subsequently yesterday, he noticed this happening again and his son noticed that he had a facial droop and therefore   LKW: Sunday morning tnk given?: no, onset of window  No past medical history on file.   No family history on file.   Social History:  has no history on file for tobacco use, alcohol use, and drug use.   Exam: Current vital signs: BP (!) 161/138 (BP Location: Left Arm)   Pulse 87   Temp 97.8 F (36.6 C) (Oral)   Resp 18   Ht 5\' 10"  (1.778 m)   SpO2 100%  Vital signs in last 24 hours: Temp:  [97.8 F (36.6 C)-98.1 F (36.7 C)] 97.8 F (36.6 C) (03/19 0506) Pulse Rate:  [83-103] 87 (03/19 0506) Resp:  [16-26] 18 (03/19 0506) BP: (161-205)/(94-138) 161/138 (03/19 0506) SpO2:  [94 %-100 %] 100 % (03/19 0506)   Physical Exam  Appears well-developed and well-nourished.   Neuro: Mental Status: Patient is awake, alert, oriented to person, place, month, year, and situation. Patient is able to give a clear and coherent history. No signs of aphasia or neglect Cranial Nerves: II: Visual Fields are full. Pupils are equal, round, and reactive to light.   III,IV, VI: EOMI without ptosis or diploplia.  V: Facial sensation is symmetric to temperature VII: Facial movement with right-sided weakness VIII: hearing is intact to voice X: Uvula elevates symmetrically XI: Shoulder shrug is symmetric. XII: tongue is midline without atrophy or fasciculations.  Motor: Tone is normal. Bulk is normal. 5/5 strength was present in all four extremities.  Sensory: Sensation is symmetric to light  touch and temperature in the arms and legs. Cerebellar: He has a mild postural and intentional tremor bilaterally.  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  He will need secondary risk factor modification, with LDL goal less than 70, A1c goal less than seven, permissive hypertension in the short-term but over the long-term will need blood pressure control.  Can hold Eliquis for the time being, likely restart relatively soon though given small size of stroke.  Recommendations: - HgbA1c, fasting lipid panel - Frequent neuro checks - Prophylactic therapy-Antiplatelet med: Aspirin - dose 81mg  for now - Risk factor modification - Telemetry monitoring - PT consult, OT consult, Speech consult - Stroke team to follow   Roland Rack, MD [facility] [phone]  If 7pm- 7am, please page neurology on call as listed in [place].

## 2022-05-13 NOTE — Progress Notes (Signed)
OT Cancellation Note  Patient Details Name: Ricky Juarez MRN: [id number] DOB: [birth year]   Cancelled Treatment:    Reason Eval/Treat Not Completed: Patient at procedure or test/ unavailable will check back as able.   [facility] [phone]   Delight Stare 05/13/2022, 12:25 PM

## 2022-05-13 NOTE — Discharge Summary (Signed)
PATIENT DETAILS Name: Ricky Juarez Age: 77 y.o. Sex: male Date of Birth: 19-Oct-1945 MRN: [id number]. Admitting Physician: Clance Boll, MD [id number], Earnest Conroy, MD  Admit Date: 05/12/2022 Discharge date: 05/13/2022  Recommendations for Outpatient Follow-up:  Follow up with PCP in 1-2 weeks Please obtain CMP/CBC in one week Please ensure follow-up with stroke clinic/neurology.    Admitted From:  Home  Disposition: Home   Discharge Condition: fair  CODE STATUS:   Code Status: Full Code   Diet recommendation:  Diet Order             Diet heart healthy/carb modified Room service appropriate? Yes; Fluid consistency: Thin  Diet effective now           Diet - low sodium heart healthy                    Brief Summary: 77 year old with chronic atrial fibrillation on Eliquis, HTN, DM-2-who presented with slurred speech/right facial droop-upon further evaluation-was found to have an acute CVA  3/18>> MRI brain: Acute ischemic left basal ganglia infarct. 3/19>> CTA head/neck: Negative LVO-no significant stenosis in head/neck. 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 Jennings Books small vessel disease but given history of A-fib-embolic etiology always possible.  Patient already on Eliquis which will be continued.  Neurology recommending addition of low-dose aspirin.  Already on Lipitor 10 mg, this has been increased to 40 mg dosage. Please ensure outpatient follow-up with neurology  Chronic A-fib Stable Resume metoprolol/Cardizem/Eliquis  HTN Initially permissive hypertension allowed Cardizem/metoprolol resumed  HLD Lipitor increased to high intensity dosage  DM-2 A1c pending Resume Mounjaro  BPH Flomax  Eczema Continue follow-up with  dermatology Resume usual medications post discharge  Discharge Diagnoses:  Principal Problem:   Acute CVA (cerebrovascular accident) [facility])   Discharge Instructions:  Activity:  As tolerated    Discharge Instructions     Ambulatory referral to Neurology   Complete by: As directed    Follow up in stroke clinic at [facility] with Frann Rider, NP in about 4 weeks. If not available, consider Dr. Antony Contras, Dr. Bess Harvest or Dr. Sarina Ill.   Ambulatory referral to Neurology   Complete by: As directed    An appointment is requested in approximately: 8 weeks   Diet - low sodium heart healthy   Complete by: As directed    Discharge instructions   Complete by: As directed    Follow with Primary MD  Nickola Major, MD in 1-2 weeks  Follow-up with clinic-their office will give you a call regarding a follow-up appointment  Your cholesterol medication-Lipitor has been increased to 40 mg daily  Neurology is recommending that you take a baby aspirin (81 mg)-in addition to Eliquis.  A1c is pending at the time of discharge-please ask your primary care practitioner to follow-up on these results  Please get a complete blood count and chemistry panel checked by your Primary MD at your next visit, and again as instructed by your Primary MD.  Get Medicines reviewed and adjusted: Please take all your medications with you for your next visit with your Primary MD  Laboratory/radiological data: Please request your Primary MD to go over all hospital tests and procedure/radiological results at the follow up, please ask your Primary MD to get  all Hospital records sent to his/her office.  In some cases, they will be blood work, cultures and biopsy results pending at the time of your discharge. Please request that your primary care M.D. follows up on these results.  Also Note the following: If you experience worsening of your admission symptoms, develop shortness  of breath, life threatening emergency, suicidal or homicidal thoughts you must seek medical attention immediately by calling 911 or calling your MD immediately  if symptoms less severe.  You must read complete instructions/literature along with all the possible adverse reactions/side effects for all the Medicines you take and that have been prescribed to you. Take any new Medicines after you have completely understood and accpet all the possible adverse reactions/side effects.   Do not drive when taking Pain medications or sleeping medications (Benzodaizepines)  Do not take more than prescribed Pain, Sleep and Anxiety Medications. It is not advisable to combine anxiety,sleep and pain medications without talking with your primary care practitioner  Special Instructions: If you have smoked or chewed Tobacco  in the last 2 yrs please stop smoking, stop any regular Alcohol  and or any Recreational drug use.  Wear Seat belts while driving.  Please note: You were cared for by a hospitalist during your hospital stay. Once you are discharged, your primary care physician will handle any further medical issues. Please note that NO REFILLS for any discharge medications will be authorized once you are discharged, as it is imperative that you return to your primary care physician (or establish a relationship with a primary care physician if you do not have one) for your post hospital discharge needs so that they can reassess your need for medications and monitor your lab values.   Increase activity slowly   Complete by: As directed       Allergies as of 05/13/2022       Reactions   Penicillin G Anaphylaxis, Rash        Medication List     TAKE these medications    aspirin EC 81 MG tablet Take 1 tablet (81 mg total) by mouth daily. Swallow whole. Start taking on: May 14, 2022   atorvastatin 40 MG tablet Commonly known as: LIPITOR Take 1 tablet (40 mg total) by mouth daily. What changed:   medication strength how much to take   clobetasol cream 0.05 % Commonly known as: TEMOVATE Apply 1 Application topically daily as needed (for rash).   desonide 0.05 % cream Commonly known as: DESOWEN Apply 1 Application topically daily as needed (for rash).   diltiazem 30 MG tablet Commonly known as: CARDIZEM Take 30 mg by mouth daily as needed (for BP >170).   diltiazem 360 MG 24 hr capsule Commonly known as: CARDIZEM CD Take 360 mg by mouth daily.   Eliquis 5 MG Tabs tablet Generic drug: apixaban Take 5 mg by mouth 2 (two) times daily.   losartan-hydrochlorothiazide 100-25 MG tablet Commonly known as: HYZAAR Take 1 tablet by mouth daily.   metoprolol succinate 25 MG 24 hr tablet Commonly known as: TOPROL-XL Take 25 mg by mouth daily.   Mounjaro 2.5 MG/0.5ML Pen Generic drug: tirzepatide Inject 2.5 mg into the skin once a week. Then increase to 5mg  weekly (new separate prescription sent) - Saturday   mycophenolate 250 MG capsule Commonly known as: CELLCEPT Take 250 mg by mouth in the morning, at noon, and at bedtime. For rash   primidone 50 MG tablet Commonly known as: MYSOLINE Take 50-100 mg by  mouth See admin instructions. Take 1 tablet by mouth 1 night and alternate with 2 tablets the next night   tamsulosin 0.4 MG Caps capsule Commonly known as: FLOMAX Take 0.4 mg by mouth every evening.   torsemide 10 MG tablet Commonly known as: DEMADEX Take 10 mg by mouth daily as needed (for swelling).        Follow-up Information     Nickola Major, MD. Schedule an appointment as soon as possible for a visit in 1 week(s).   Specialty: Family Medicine Contact information: [address] [place] [address] [phone]         Garvin Fila, MD Follow up.   Specialties: Neurology, Radiology Why: Hospital follow up, Office will call with date/time, If you dont hear from them,please give them a call Contact information: [address] [place] [address] [phone]                Allergies  Allergen Reactions   Penicillin G Anaphylaxis and Rash     Other Procedures/Studies: ECHOCARDIOGRAM COMPLETE BUBBLE STUDY  Result Date: 05/13/2022    ECHOCARDIOGRAM REPORT   Patient Name:   Ricky Juarez Date of Exam: 05/13/2022 Medical Rec #:  [id number]  Height:       70.0 in Accession #:    [id number] Weight: Date of Birth:  [birth year]  BSA: Patient Age:    55 years   BP:           161/138 mmHg Patient Gender: M          HR:           98 bpm. Exam Location:  Inpatient Procedure: 2D Echo, Cardiac Doppler, Color Doppler and Saline Contrast Bubble            Study Indications:   Stroke 434.91/I63.9  History:       Patient has no prior history of Echocardiogram examinations.                Stroke.  Sonographer:   Ronny Flurry Referring      [id number] SARA-MAIZ A THOMAS Phys: IMPRESSIONS  1. Technically difficult study with poor visualization of cardiac structures. Patient in atrial fibrillation.  2. Left ventricular ejection fraction, by estimation, is 55 to 60%. The left ventricle has normal function. The left ventricle has no regional wall motion abnormalities. Left ventricular diastolic function could not be evaluated.  3. Right ventricular systolic function is normal. The right ventricular size is normal.  4. The mitral valve is normal in structure. Trivial mitral valve regurgitation. No evidence of mitral stenosis.  5. The aortic valve is normal in structure. Aortic valve regurgitation is trivial. No aortic stenosis is present.  6. The inferior vena cava is normal in size with greater than 50% respiratory variability, suggesting right atrial pressure of 3 mmHg.  7. Agitated saline contrast bubble study was negative, with no evidence of any interatrial shunt. FINDINGS  Left Ventricle: Left ventricular ejection fraction, by estimation, is 55 to 60%. The left ventricle has normal function. The left ventricle has no  regional wall motion abnormalities. The left ventricular internal cavity size was normal in size. There is  no left ventricular hypertrophy. Left ventricular diastolic function could not be evaluated due to atrial fibrillation. Left ventricular diastolic function could not be evaluated. Right Ventricle: The right ventricular size is normal. No increase in right ventricular wall thickness. Right ventricular systolic function is normal. Left Atrium: Left atrial  size was normal in size. Right Atrium: Right atrial size was normal in size. Pericardium: There is no evidence of pericardial effusion. Mitral Valve: The mitral valve is normal in structure. Trivial mitral valve regurgitation. No evidence of mitral valve stenosis. Tricuspid Valve: The tricuspid valve is normal in structure. Tricuspid valve regurgitation is not demonstrated. No evidence of tricuspid stenosis. Aortic Valve: The aortic valve is normal in structure. Aortic valve regurgitation is trivial. No aortic stenosis is present. Aortic valve mean gradient measures 3.0 mmHg. Aortic valve peak gradient measures 5.4 mmHg. Aortic valve area, by VTI measures 2.48 cm. Pulmonic Valve: The pulmonic valve was normal in structure. Pulmonic valve regurgitation is not visualized. No evidence of pulmonic stenosis. Aorta: The aortic root is normal in size and structure. Venous: The inferior vena cava is normal in size with greater than 50% respiratory variability, suggesting right atrial pressure of 3 mmHg. IAS/Shunts: No atrial level shunt detected by color flow Doppler. Agitated saline contrast was given intravenously to evaluate for intracardiac shunting. Agitated saline contrast bubble study was negative, with no evidence of any interatrial shunt.  LEFT VENTRICLE PLAX 2D LVOT diam:     2.30 cm   Diastology LV SV:         50        LV e' medial:    7.10 cm/s LVOT Area:     4.15 cm  LV E/e' medial:  9.8                          LV e' lateral:   9.00 cm/s                           LV E/e' lateral: 7.7  RIGHT VENTRICLE             IVC RV S prime:     16.00 cm/s  IVC diam: 1.70 cm LEFT ATRIUM            RIGHT ATRIUM LA Vol (A2C): 52.2 ml  RA Area:     18.40 cm LA Vol (A4C): 110.0 ml RA Volume:   46.10 ml  AORTIC VALVE AV Area (Vmax):    2.85 cm AV Area (Vmean):   2.53 cm AV Area (VTI):     2.48 cm AV Vmax:           116.50 cm/s AV Vmean:          83.100 cm/s AV VTI:            0.200 m AV Peak Grad:      5.4 mmHg AV Mean Grad:      3.0 mmHg LVOT Vmax:         79.80 cm/s LVOT Vmean:        50.667 cm/s LVOT VTI:          0.119 m LVOT/AV VTI ratio: 0.60  AORTA Ao Root diam: 3.70 cm Ao Asc diam:  3.50 cm MV E velocity: 69.40 cm/s                            SHUNTS                            Systemic VTI:  0.12 m  Systemic Diam: 2.30 cm Aditya Sabharwal Electronically signed by Hebert Soho Signature Date/Time: 05/13/2022/1:50:58 PM    Final    DG Chest 2 View  Result Date: 05/13/2022 CLINICAL DATA:  77 year old male with history of acute cerebrovascular accident presenting with facial droop. EXAM: CHEST - 2 VIEW COMPARISON:  None available. FINDINGS: Lung volumes are normal. No consolidative airspace disease. No pleural effusions. No pneumothorax. No evidence of pulmonary edema. Heart size is mildly enlarged. The patient is rotated to the right on today's exam, resulting in distortion of the mediastinal contours and reduced diagnostic sensitivity and specificity for mediastinal pathology. Atherosclerotic calcifications are noted in the thoracic aorta. IMPRESSION: 1. No radiographic evidence of acute cardiopulmonary disease. 2. Mild cardiomegaly. 3. Aortic atherosclerosis. Electronically Signed   By: Vinnie Langton M.D.   On: 05/13/2022 05:09   CT Angio Head Neck W WO CM  Result Date: 05/13/2022 CLINICAL DATA:  77 year old male presenting yesterday with right facial droop and weakness. MRA reveals patchy acute infarct in the left basal ganglia,  corona radiata. EXAM: CT ANGIOGRAPHY HEAD AND NECK TECHNIQUE: Multidetector CT imaging of the head and neck was performed using the standard protocol during bolus administration of intravenous contrast. Multiplanar CT image reconstructions and MIPs were obtained to evaluate the vascular anatomy. Carotid stenosis measurements (when applicable) are obtained utilizing NASCET criteria, using the distal internal carotid diameter as the denominator. RADIATION DOSE REDUCTION: This exam was performed according to the departmental dose-optimization program which includes automated exposure control, adjustment of the mA and/or kV according to patient size and/or use of iterative reconstruction technique. CONTRAST:  1mL OMNIPAQUE IOHEXOL 350 MG/ML SOLN COMPARISON:  Brain MRI 0009 hours today.  Head CT yesterday. FINDINGS: CTA NECK Skeleton: Dental streak artifact affects detail of the mandible. Chronic C6-C7 ankylosis or arthrodesis. Superimposed anterolisthesis at the cervicothoracic junction with chronic facet arthropathy and evidence of developing facet ankylosis there. Bulky adjacent segment disease at C5-C6. Bulky anterior endplate osteophytosis also at C4. No acute osseous abnormality identified. Upper chest: Mild dependent atelectasis. Negative visible superior mediastinum. Other neck: No acute finding. Mild left submandibular gland sialolithiasis. Aortic arch: Mildly tortuous aortic arch and proximal great vessels. Mild to moderate calcified arch atherosclerosis. Right carotid system: Highly tortuous brachiocephalic artery although with little plaque. Patent right CCA origin without stenosis. No significant plaque before the right carotid bifurcation. Soft and calcified plaque at the posterior right ICA origin without stenosis. Distal calcified plaque beginning at the bulb and continuing distally but less than 50 % stenosis with respect to the distal vessel. Left carotid system: Mild calcified plaque at the left CCA  origin without stenosis. Tortuous left CCA. Mild soft and calcified plaque at the left ICA origin and bulb without stenosis. Vertebral arteries: Calcified and tortuous right subclavian artery origin with less than 50 % stenosis with respect to the distal vessel. Calcified plaque at the right vertebral artery origin but only mild stenosis (series 9, image 162). Right vertebral artery patent to the skull base with no other significant plaque or stenosis. No significant proximal left subclavian artery plaque. Normal left vertebral artery origin. Tortuous left V1 segment. Mildly dominant left vertebral artery is patent to the skull base with no significant plaque or stenosis. CTA HEAD Posterior circulation: Dominant left V4 segment. No significant distal vertebral or vertebrobasilar junction plaque or stenosis. Normal PICA origins. Patent basilar artery without stenosis. Normal SCA and PCA origins. Mildly ectatic basilar tip but no discrete aneurysm. Right posterior communicating artery is present, the  left is diminutive or absent. Left PCA branches are within normal limits. There is mild right P2 segment irregularity without significant stenosis (series 11, image 22). Anterior circulation: Both ICA siphons are patent. Mostly cavernous segment calcified plaque on the left. Noncalcified irregularity of the left ICA supraclinoid segment. No significant left siphon stenosis. Contralateral right Saif appears mildly dominant. Mild right siphon soft and calcified plaque without significant stenosis. Ophthalmic artery and right posterior communicating artery origins are within normal limits. Patent carotid termini, MCA and ACA origins. Mildly dominant right A1. Normal anterior communicating artery. Bilateral ACA branches are within normal limits. Left MCA M1 segment and trifurcation are patent without stenosis. Left MCA branches appear within normal limits. Right MCA M1 segment and bifurcation are patent without stenosis.  Right MCA branches are patent with mild M2 irregularity. No significant right MCA branch stenosis. Venous sinuses: Patent. Right transverse and sigmoid venous sinuses are dominant along with the right IJ. Anatomic variants: Mildly dominant left vertebral artery, right ICA siphon, right ACA A1. Dominant right transverse and sigmoid venous sinuses along with the right internal jugular vein. Review of the MIP images confirms the above findings IMPRESSION: 1. Negative for large vessel occlusion, and generally mild for age atherosclerosis in the head and neck. Scattered atherosclerosis, including conspicuous calcified plaque at the Right ICA bulb, but no significant arterial stenosis identified. 2. Advanced cervical spine degeneration superimposed on chronic C6-C7 ankylosis or postoperative arthrodesis, developing facet ankylosis at the cervicothoracic junction. 3. Mild left submandibular gland sialolithiasis. Electronically Signed   By: Genevie Ann M.D.   On: 05/13/2022 04:45   MR Brain Wo Contrast (neuro protocol)  Result Date: 05/13/2022 CLINICAL DATA:  Initial evaluation for neuro deficit, stroke. EXAM: MRI HEAD WITHOUT CONTRAST TECHNIQUE: Multiplanar, multiecho pulse sequences of the brain and surrounding structures were obtained without intravenous contrast. COMPARISON:  Prior CT from 05/12/2022. FINDINGS: Brain: Generalized age-related cerebral atrophy. Patchy T2/FLAIR hyperintensity involving the periventricular and deep white matter both cerebral hemispheres, consistent with chronic small vessel ischemic disease, moderate in nature. Patchy small volume restricted diffusion involving the left basal ganglia/corona radiata, consistent with acute ischemic infarct (series 5, image 76). No associated hemorrhage or mass effect. No other evidence for acute or subacute ischemia. Gray-white matter differentiation otherwise maintained. No areas of chronic cortical infarction. No acute intracranial hemorrhage. Single  punctate chronic microhemorrhage noted within the right cerebellum. No mass lesion, midline shift or mass effect. No hydrocephalus or extra-axial fluid collection. Pituitary gland and suprasellar region within normal limits. Vascular: Major intracranial vascular flow voids are maintained. Skull and upper cervical spine: Craniocervical junction within normal limits. Bone marrow signal intensity normal. No scalp soft tissue abnormality. Sinuses/Orbits: Prior bilateral ocular lens replacement. Paranasal sinuses are largely clear. No mastoid effusion. Other: None. IMPRESSION: 1. 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 2. Underlying age-related cerebral atrophy with moderate chronic microvascular ischemic disease. Electronically Signed   By: Jeannine Boga M.D.   On: 05/13/2022 03:02   CT Head Wo Contrast  Result Date: 05/12/2022 CLINICAL DATA:  Right facial droop, weakness EXAM: CT HEAD WITHOUT CONTRAST TECHNIQUE: Contiguous axial images were obtained from the base of the skull through the vertex without intravenous contrast. RADIATION DOSE REDUCTION: This exam was performed according to the departmental dose-optimization program which includes automated exposure control, adjustment of the mA and/or kV according to patient size and/or use of iterative reconstruction technique. COMPARISON:  None Available. FINDINGS: Brain: Normal anatomic configuration. Parenchymal volume loss  is commensurate with the patient's age. Mild periventricular white matter changes are present likely reflecting the sequela of small vessel ischemia. No abnormal intra or extra-axial mass lesion or fluid collection. No abnormal mass effect or midline shift. No evidence of acute intracranial hemorrhage or infarct. Ventricular size is normal. Cerebellum unremarkable. Vascular: No asymmetric hyperdense vasculature at the skull base. Skull: Intact Sinuses/Orbits: Paranasal sinuses are  clear. Orbits are unremarkable. Other: Mastoid air cells and middle ear cavities are clear. IMPRESSION: 1. No acute intracranial hemorrhage or infarct. 2. Mild senescent change. Electronically Signed   By: Fidela Salisbury M.D.   On: 05/12/2022 19:46     TODAY-DAY OF DISCHARGE:  Subjective:   Ricky Juarez today has no headache,no chest abdominal pain,no new weakness tingling or numbness, feels much better wants to go home today.   Objective:   Blood pressure (!) 186/117, pulse 94, temperature 97.7 F (36.5 C), temperature source Oral, resp. rate 20, height 5\' 10"  (1.778 m), SpO2 97 %. No intake or output data in the 24 hours ending 05/13/22 1425 There were no vitals filed for this visit.  Exam: Awake Alert, Oriented *3, No new F.N deficits, Normal affect [place].AT,PERRAL Supple Neck,No JVD, No cervical lymphadenopathy appriciated.  Symmetrical Chest wall movement, Good air movement bilaterally, CTAB RRR,No Gallops,Rubs or new Murmurs, No Parasternal Heave +ve B.Sounds, Abd Soft, Non tender, No organomegaly appriciated, No rebound -guarding or rigidity. No Cyanosis, Clubbing or edema, No new Rash or bruise   PERTINENT RADIOLOGIC STUDIES: ECHOCARDIOGRAM COMPLETE BUBBLE STUDY  Result Date: 05/13/2022    ECHOCARDIOGRAM REPORT   Patient Name:   Ricky Juarez Date of Exam: 05/13/2022 Medical Rec #:  [id number]  Height:       70.0 in Accession #:    [id number] Weight: Date of Birth:  Apr 08, 1945  BSA: Patient Age:    55 years   BP:           161/138 mmHg Patient Gender: M          HR:           98 bpm. Exam Location:  Inpatient Procedure: 2D Echo, Cardiac Doppler, Color Doppler and Saline Contrast Bubble            Study Indications:   Stroke 434.91/I63.9  History:       Patient has no prior history of Echocardiogram examinations.                Stroke.  Sonographer:   Ronny Flurry Referring      [id number] SARA-MAIZ A THOMAS Phys: IMPRESSIONS  1. Technically difficult study with poor visualization of  cardiac structures. Patient in atrial fibrillation.  2. Left ventricular ejection fraction, by estimation, is 55 to 60%. The left ventricle has normal function. The left ventricle has no regional wall motion abnormalities. Left ventricular diastolic function could not be evaluated.  3. Right ventricular systolic function is normal. The right ventricular size is normal.  4. The mitral valve is normal in structure. Trivial mitral valve regurgitation. No evidence of mitral stenosis.  5. The aortic valve is normal in structure. Aortic valve regurgitation is trivial. No aortic stenosis is present.  6. The inferior vena cava is normal in size with greater than 50% respiratory variability, suggesting right atrial pressure of 3 mmHg.  7. Agitated saline contrast bubble study was negative, with no evidence of any interatrial shunt. FINDINGS  Left Ventricle: Left ventricular ejection fraction, by estimation, is 55 to 60%. The left ventricle has  normal function. The left ventricle has no regional wall motion abnormalities. The left ventricular internal cavity size was normal in size. There is  no left ventricular hypertrophy. Left ventricular diastolic function could not be evaluated due to atrial fibrillation. Left ventricular diastolic function could not be evaluated. Right Ventricle: The right ventricular size is normal. No increase in right ventricular wall thickness. Right ventricular systolic function is normal. Left Atrium: Left atrial size was normal in size. Right Atrium: Right atrial size was normal in size. Pericardium: There is no evidence of pericardial effusion. Mitral Valve: The mitral valve is normal in structure. Trivial mitral valve regurgitation. No evidence of mitral valve stenosis. Tricuspid Valve: The tricuspid valve is normal in structure. Tricuspid valve regurgitation is not demonstrated. No evidence of tricuspid stenosis. Aortic Valve: The aortic valve is normal in structure. Aortic valve regurgitation  is trivial. No aortic stenosis is present. Aortic valve mean gradient measures 3.0 mmHg. Aortic valve peak gradient measures 5.4 mmHg. Aortic valve area, by VTI measures 2.48 cm. Pulmonic Valve: The pulmonic valve was normal in structure. Pulmonic valve regurgitation is not visualized. No evidence of pulmonic stenosis. Aorta: The aortic root is normal in size and structure. Venous: The inferior vena cava is normal in size with greater than 50% respiratory variability, suggesting right atrial pressure of 3 mmHg. IAS/Shunts: No atrial level shunt detected by color flow Doppler. Agitated saline contrast was given intravenously to evaluate for intracardiac shunting. Agitated saline contrast bubble study was negative, with no evidence of any interatrial shunt.  LEFT VENTRICLE PLAX 2D LVOT diam:     2.30 cm   Diastology LV SV:         50        LV e' medial:    7.10 cm/s LVOT Area:     4.15 cm  LV E/e' medial:  9.8                          LV e' lateral:   9.00 cm/s                          LV E/e' lateral: 7.7  RIGHT VENTRICLE             IVC RV S prime:     16.00 cm/s  IVC diam: 1.70 cm LEFT ATRIUM            RIGHT ATRIUM LA Vol (A2C): 52.2 ml  RA Area:     18.40 cm LA Vol (A4C): 110.0 ml RA Volume:   46.10 ml  AORTIC VALVE AV Area (Vmax):    2.85 cm AV Area (Vmean):   2.53 cm AV Area (VTI):     2.48 cm AV Vmax:           116.50 cm/s AV Vmean:          83.100 cm/s AV VTI:            0.200 m AV Peak Grad:      5.4 mmHg AV Mean Grad:      3.0 mmHg LVOT Vmax:         79.80 cm/s LVOT Vmean:        50.667 cm/s LVOT VTI:          0.119 m LVOT/AV VTI ratio: 0.60  AORTA Ao Root diam: 3.70 cm Ao Asc diam:  3.50 cm MV E velocity: 69.40 cm/s  SHUNTS                            Systemic VTI:  0.12 m                            Systemic Diam: 2.30 cm Aditya Sabharwal Electronically signed by Hebert Soho Signature Date/Time: 05/13/2022/1:50:58 PM    Final    DG Chest 2 View  Result Date:  05/13/2022 CLINICAL DATA:  77 year old male with history of acute cerebrovascular accident presenting with facial droop. EXAM: CHEST - 2 VIEW COMPARISON:  None available. FINDINGS: Lung volumes are normal. No consolidative airspace disease. No pleural effusions. No pneumothorax. No evidence of pulmonary edema. Heart size is mildly enlarged. The patient is rotated to the right on today's exam, resulting in distortion of the mediastinal contours and reduced diagnostic sensitivity and specificity for mediastinal pathology. Atherosclerotic calcifications are noted in the thoracic aorta. IMPRESSION: 1. No radiographic evidence of acute cardiopulmonary disease. 2. Mild cardiomegaly. 3. Aortic atherosclerosis. Electronically Signed   By: Vinnie Langton M.D.   On: 05/13/2022 05:09   CT Angio Head Neck W WO CM  Result Date: 05/13/2022 CLINICAL DATA:  77 year old male presenting yesterday with right facial droop and weakness. MRA reveals patchy acute infarct in the left basal ganglia, corona radiata. EXAM: CT ANGIOGRAPHY HEAD AND NECK TECHNIQUE: Multidetector CT imaging of the head and neck was performed using the standard protocol during bolus administration of intravenous contrast. Multiplanar CT image reconstructions and MIPs were obtained to evaluate the vascular anatomy. Carotid stenosis measurements (when applicable) are obtained utilizing NASCET criteria, using the distal internal carotid diameter as the denominator. RADIATION DOSE REDUCTION: This exam was performed according to the departmental dose-optimization program which includes automated exposure control, adjustment of the mA and/or kV according to patient size and/or use of iterative reconstruction technique. CONTRAST:  28mL OMNIPAQUE IOHEXOL 350 MG/ML SOLN COMPARISON:  Brain MRI 0009 hours today.  Head CT yesterday. FINDINGS: CTA NECK Skeleton: Dental streak artifact affects detail of the mandible. Chronic C6-C7 ankylosis or arthrodesis. Superimposed  anterolisthesis at the cervicothoracic junction with chronic facet arthropathy and evidence of developing facet ankylosis there. Bulky adjacent segment disease at C5-C6. Bulky anterior endplate osteophytosis also at C4. No acute osseous abnormality identified. Upper chest: Mild dependent atelectasis. Negative visible superior mediastinum. Other neck: No acute finding. Mild left submandibular gland sialolithiasis. Aortic arch: Mildly tortuous aortic arch and proximal great vessels. Mild to moderate calcified arch atherosclerosis. Right carotid system: Highly tortuous brachiocephalic artery although with little plaque. Patent right CCA origin without stenosis. No significant plaque before the right carotid bifurcation. Soft and calcified plaque at the posterior right ICA origin without stenosis. Distal calcified plaque beginning at the bulb and continuing distally but less than 50 % stenosis with respect to the distal vessel. Left carotid system: Mild calcified plaque at the left CCA origin without stenosis. Tortuous left CCA. Mild soft and calcified plaque at the left ICA origin and bulb without stenosis. Vertebral arteries: Calcified and tortuous right subclavian artery origin with less than 50 % stenosis with respect to the distal vessel. Calcified plaque at the right vertebral artery origin but only mild stenosis (series 9, image 162). Right vertebral artery patent to the skull base with no other significant plaque or stenosis. No significant proximal left subclavian artery plaque. Normal left vertebral artery origin. Tortuous left V1 segment. Mildly dominant left  vertebral artery is patent to the skull base with no significant plaque or stenosis. CTA HEAD Posterior circulation: Dominant left V4 segment. No significant distal vertebral or vertebrobasilar junction plaque or stenosis. Normal PICA origins. Patent basilar artery without stenosis. Normal SCA and PCA origins. Mildly ectatic basilar tip but no discrete  aneurysm. Right posterior communicating artery is present, the left is diminutive or absent. Left PCA branches are within normal limits. There is mild right P2 segment irregularity without significant stenosis (series 11, image 22). Anterior circulation: Both ICA siphons are patent. Mostly cavernous segment calcified plaque on the left. Noncalcified irregularity of the left ICA supraclinoid segment. No significant left siphon stenosis. Contralateral right Saif appears mildly dominant. Mild right siphon soft and calcified plaque without significant stenosis. Ophthalmic artery and right posterior communicating artery origins are within normal limits. Patent carotid termini, MCA and ACA origins. Mildly dominant right A1. Normal anterior communicating artery. Bilateral ACA branches are within normal limits. Left MCA M1 segment and trifurcation are patent without stenosis. Left MCA branches appear within normal limits. Right MCA M1 segment and bifurcation are patent without stenosis. Right MCA branches are patent with mild M2 irregularity. No significant right MCA branch stenosis. Venous sinuses: Patent. Right transverse and sigmoid venous sinuses are dominant along with the right IJ. Anatomic variants: Mildly dominant left vertebral artery, right ICA siphon, right ACA A1. Dominant right transverse and sigmoid venous sinuses along with the right internal jugular vein. Review of the MIP images confirms the above findings IMPRESSION: 1. Negative for large vessel occlusion, and generally mild for age atherosclerosis in the head and neck. Scattered atherosclerosis, including conspicuous calcified plaque at the Right ICA bulb, but no significant arterial stenosis identified. 2. Advanced cervical spine degeneration superimposed on chronic C6-C7 ankylosis or postoperative arthrodesis, developing facet ankylosis at the cervicothoracic junction. 3. Mild left submandibular gland sialolithiasis. Electronically Signed   By: Genevie Ann  M.D.   On: 05/13/2022 04:45   MR Brain Wo Contrast (neuro protocol)  Result Date: 05/13/2022 CLINICAL DATA:  Initial evaluation for neuro deficit, stroke. EXAM: MRI HEAD WITHOUT CONTRAST TECHNIQUE: Multiplanar, multiecho pulse sequences of the brain and surrounding structures were obtained without intravenous contrast. COMPARISON:  Prior CT from 05/12/2022. FINDINGS: Brain: Generalized age-related cerebral atrophy. Patchy T2/FLAIR hyperintensity involving the periventricular and deep white matter both cerebral hemispheres, consistent with chronic small vessel ischemic disease, moderate in nature. Patchy small volume restricted diffusion involving the left basal ganglia/corona radiata, consistent with acute ischemic infarct (series 5, image 76). No associated hemorrhage or mass effect. No other evidence for acute or subacute ischemia. Gray-white matter differentiation otherwise maintained. No areas of chronic cortical infarction. No acute intracranial hemorrhage. Single punctate chronic microhemorrhage noted within the right cerebellum. No mass lesion, midline shift or mass effect. No hydrocephalus or extra-axial fluid collection. Pituitary gland and suprasellar region within normal limits. Vascular: Major intracranial vascular flow voids are maintained. Skull and upper cervical spine: Craniocervical junction within normal limits. Bone marrow signal intensity normal. No scalp soft tissue abnormality. Sinuses/Orbits: Prior bilateral ocular lens replacement. Paranasal sinuses are largely clear. No mastoid effusion. Other: None. IMPRESSION: 1. 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 2. Underlying age-related cerebral atrophy with moderate chronic microvascular ischemic disease. Electronically Signed   By: Jeannine Boga M.D.   On: 05/13/2022 03:02   CT Head Wo Contrast  Result Date: 05/12/2022 CLINICAL DATA:  Right facial droop, weakness EXAM: CT HEAD  WITHOUT CONTRAST TECHNIQUE: Contiguous axial  images were obtained from the base of the skull through the vertex without intravenous contrast. RADIATION DOSE REDUCTION: This exam was performed according to the departmental dose-optimization program which includes automated exposure control, adjustment of the mA and/or kV according to patient size and/or use of iterative reconstruction technique. COMPARISON:  None Available. FINDINGS: Brain: Normal anatomic configuration. Parenchymal volume loss is commensurate with the patient's age. Mild periventricular white matter changes are present likely reflecting the sequela of small vessel ischemia. No abnormal intra or extra-axial mass lesion or fluid collection. No abnormal mass effect or midline shift. No evidence of acute intracranial hemorrhage or infarct. Ventricular size is normal. Cerebellum unremarkable. Vascular: No asymmetric hyperdense vasculature at the skull base. Skull: Intact Sinuses/Orbits: Paranasal sinuses are clear. Orbits are unremarkable. Other: Mastoid air cells and middle ear cavities are clear. IMPRESSION: 1. No acute intracranial hemorrhage or infarct. 2. Mild senescent change. Electronically Signed   By: Fidela Salisbury M.D.   On: 05/12/2022 19:46     PERTINENT LAB RESULTS: CBC: Recent Labs    05/12/22 1844  WBC 8.7  HGB 18.2*  HCT 55.0*  PLT 269   CMET CMP     Component Value Date/Time   NA 138 05/12/2022 1844   K 3.3 (L) 05/12/2022 1844   CL 95 (L) 05/12/2022 1844   CO2 30 05/12/2022 1844   GLUCOSE 85 05/12/2022 1844   BUN 18 05/12/2022 1844   CREATININE 1.11 05/12/2022 1844   CALCIUM 9.4 05/12/2022 1844   GFRNONAA >60 05/12/2022 1844    GFR CrCl cannot be calculated (Unknown ideal weight.). No results for input(s): "LIPASE", "AMYLASE" in the last 72 hours. No results for input(s): "CKTOTAL", "CKMB", "CKMBINDEX", "TROPONINI" in the last 72 hours. Invalid input(s): "POCBNP" No results for input(s): "DDIMER" in the  last 72 hours. No results for input(s): "HGBA1C" in the last 72 hours. Recent Labs    05/13/22 0605  CHOL 175  HDL 54  LDLCALC 111*  TRIG 49  CHOLHDL 3.2   No results for input(s): "TSH", "T4TOTAL", "T3FREE", "THYROIDAB" in the last 72 hours.  Invalid input(s): "FREET3" No results for input(s): "VITAMINB12", "FOLATE", "FERRITIN", "TIBC", "IRON", "RETICCTPCT" in the last 72 hours. Coags: No results for input(s): "INR" in the last 72 hours.  Invalid input(s): "PT" Microbiology: No results found for this or any previous visit (from the past 240 hour(s)).  FURTHER DISCHARGE INSTRUCTIONS:  Get Medicines reviewed and adjusted: Please take all your medications with you for your next visit with your Primary MD  Laboratory/radiological data: Please request your Primary MD to go over all hospital tests and procedure/radiological results at the follow up, please ask your Primary MD to get all Hospital records sent to his/her office.  In some cases, they will be blood work, cultures and biopsy results pending at the time of your discharge. Please request that your primary care M.D. goes through all the records of your hospital data and follows up on these results.  Also Note the following: If you experience worsening of your admission symptoms, develop shortness of breath, life threatening emergency, suicidal or homicidal thoughts you must seek medical attention immediately by calling 911 or calling your MD immediately  if symptoms less severe.  You must read complete instructions/literature along with all the possible adverse reactions/side effects for all the Medicines you take and that have been prescribed to you. Take any new Medicines after you have completely understood and accpet all the possible adverse reactions/side effects.   Do not  drive when taking Pain medications or sleeping medications (Benzodaizepines)  Do not take more than prescribed Pain, Sleep and Anxiety Medications.  It is not advisable to combine anxiety,sleep and pain medications without talking with your primary care practitioner  Special Instructions: If you have smoked or chewed Tobacco  in the last 2 yrs please stop smoking, stop any regular Alcohol  and or any Recreational drug use.  Wear Seat belts while driving.  Please note: You were cared for by a hospitalist during your hospital stay. Once you are discharged, your primary care physician will handle any further medical issues. Please note that NO REFILLS for any discharge medications will be authorized once you are discharged, as it is imperative that you return to your primary care physician (or establish a relationship with a primary care physician if you do not have one) for your post hospital discharge needs so that they can reassess your need for medications and monitor your lab values.  Total Time spent coordinating discharge including counseling, education and face to face time equals greater than 30 minutes.  Signed: Genesia Caslin 05/13/2022 2:25 PM

## 2022-05-13 NOTE — ED Provider Notes (Signed)
Discussed with Dr. Leonel Ramsay with neurology. MRI reveals new stroke.  He recommends admission. Patient updated on plan. Will allow permissive hypertension Discussed with Dr. Marcello Moores for admission   Ripley Fraise, MD 05/13/22 [phone]

## 2022-05-13 NOTE — ED Notes (Signed)
Dr Thomas at bedside

## 2022-05-13 NOTE — TOC Transition Note (Signed)
Transition of Care [facility]) - CM/SW Discharge Note   Patient Details  Name: Ricky Juarez MRN: [id number] Date of Birth: 30-Sep-1945  [facility] of [facility]) CM/SW Contact:  Pollie Friar, RN Phone Number: 05/13/2022, 11:56 AM   Clinical Narrative:    Pt is from home with spouse. Spouse is with him most of the time.  No DME at home.  Pt manages his own medications and denies any issues. [facility] choice [facility] [address].  Pt drives self normally but spouse can provide transportation if needed. No f/u per therapy. Son to transport home today.   Final next level of care: Home/Self Care Barriers to Discharge: No Barriers Identified   Patient Goals and CMS Choice      Discharge Placement                         Discharge Plan and Services Additional resources added to the After Visit Summary for                                       Social Determinants of Health (SDOH) Interventions SDOH Screenings   Food Insecurity: No Food Insecurity (05/13/2022)  Housing: [place]  (05/13/2022)  Transportation Needs: No Transportation Needs (05/13/2022)  Utilities: Not At Risk (05/13/2022)  Tobacco Use: Unknown (05/13/2022)     Readmission Risk Interventions     No data to display

## 2022-05-13 NOTE — Evaluation (Signed)
Speech Language Pathology Evaluation Patient Details Name: Ricky Juarez MRN: [id number] DOB: 13-Nov-1945 Today's Date: 05/13/2022 Time: [id number] SLP Time Calculation (min) (ACUTE ONLY): 11 min  Problem List:  Patient Active Problem List   Diagnosis Date Noted   Acute CVA (cerebrovascular accident) ([place]) 05/13/2022   Past Medical History: No past medical history on file. Past Surgical History:  The histories are not reviewed yet. Please review them in the "History" navigator section and refresh this [place]. HPI:  Ricky Juarez is a 77 y.o. male with medical history significant of    Hypertension, DMII, Vit D def,  Atrial  fib s/p 2 trials at ablation , OSA , BPH ,  essential tremor HLD who presents with  facial droop on the right and slurred speech. 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   Assessment / Plan / Recommendation Clinical Impression  Pt stated his dysarthria has resolved and none noted during assessment. Oromotor exam unremarkable. He was given the SLUMS and scored a 26/30 (27/30 is within normal range). Lost 2 points for word recall and 2  points in paragraph recall. He does not have concerns with his cognition and being able to independently manage his ADL's. Pt's speech and language is within normal limits and presently he is functional for communication and cognition. No further ST needed.    SLP Assessment  SLP Recommendation/Assessment: Patient does not need any further Speech [place] [facility] SLP Visit Diagnosis: Cognitive communication deficit (R41.841)    Recommendations for follow up therapy are one component of a multi-disciplinary discharge planning process, led by the attending physician.  Recommendations may be updated based on patient status, additional functional criteria and insurance authorization.    Follow Up Recommendations  No SLP follow up    Assistance Recommended at Discharge   None  Functional Status Assessment Patient has not had a recent decline in their functional status  Frequency and Duration           SLP Evaluation Cognition  Overall Cognitive Status: Within Functional Limits for tasks assessed Arousal/Alertness: Awake/alert Orientation Level: Oriented X4 Year: 2024 Day of Week: Correct Attention: Sustained Sustained Attention: Appears intact Memory: Impaired (3/5 word recall) Memory Impairment: Storage deficit;Retrieval deficit Awareness: Appears intact Problem Solving: Appears intact Safety/Judgment: Appears intact       Comprehension  Auditory Comprehension Overall Auditory Comprehension: Appears within functional limits for tasks assessed Visual Recognition/Discrimination Discrimination: Not tested Reading Comprehension Reading Status: Not tested    Expression Expression Primary Mode of Expression: Verbal Verbal Expression Overall Verbal Expression: Appears within functional limits for tasks assessed Naming: No impairment Pragmatics: No impairment Written Expression Dominant Hand: Right Written Expression: Not tested   Oral / Motor  Oral Motor/Sensory Function Overall Oral Motor/Sensory Function: Within functional limits Motor Speech Overall Motor Speech: Appears within functional limits for tasks assessed Respiration: Within functional limits Phonation: Normal Resonance: Within functional limits Articulation: Within functional limitis Intelligibility: Intelligible Motor Planning: Witnin functional limits Motor Speech Errors: Not applicable            Ricky Juarez 05/13/2022, 9:49 AM

## 2022-05-13 NOTE — Evaluation (Signed)
Physical Therapy Evaluation and Discharge Patient Details Name: Ricky Juarez MRN: [id number] DOB: [birth year] Today's Date: 05/13/2022  History of Present Illness  Ricky Juarez is a 77 y.o. male who presents 05/12/2022 with facial droop on the right and slurred speech. 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 Significant PMH: Hypertension, DMII, Vit D def, Atrial fib s/p 2 trials at ablation, OSA, BPH, essential tremor, HLD, L THA.  Clinical Impression  Patient evaluated by Physical Therapy with no further acute PT needs identified. Pt reports resolution of symptoms. Pt ambulating 400 ft with no assistive device and negotiated 5 steps independently. Scoring 21/24 on DGI, indicating he is not at high risk for falls. Demonstrates static standing balance deficits, which pt states is baseline. Education provided regarding BEFAST stroke symptoms. All education has been completed and the patient has no further questions. No follow-up Physical Therapy or equipment needs. PT is signing off. Thank you for this referral.      Recommendations for follow up therapy are one component of a multi-disciplinary discharge planning process, led by the attending physician.  Recommendations may be updated based on patient status, additional functional criteria and insurance authorization.  Follow Up Recommendations No PT follow up      Assistance Recommended at Discharge None  Patient can return home with the following       Equipment Recommendations None recommended by PT  Recommendations for Other Services       Functional Status Assessment Patient has not had a recent decline in their functional status     Precautions / Restrictions Precautions Precautions: None Restrictions Weight Bearing Restrictions: No      Mobility  Bed Mobility Overal bed mobility: Modified Independent                  Transfers Overall transfer level:  Independent Equipment used: None                    Ambulation/Gait Ambulation/Gait assistance: Independent Gait Distance (Feet): 400 Feet Assistive device: None Gait Pattern/deviations: Step-through pattern       General Gait Details: Increased L foot external rotation which is baseline (hx of L THA one year prior)  Stairs Stairs: Yes Stairs assistance: Modified independent (Device/Increase time) Stair Management: Two rails Number of Stairs: 5 General stair comments: step over step pattern  Wheelchair Mobility    Modified Rankin (Stroke Patients Only) Modified Rankin (Stroke Patients Only) Pre-Morbid Rankin Score: No symptoms Modified Rankin: No symptoms     Balance Overall balance assessment: Needs assistance Sitting-balance support: Feet supported Sitting balance-Leahy Scale: Normal Sitting balance - Comments: Donning shoes EOB   Standing balance support: No upper extremity supported, During functional activity Standing balance-Leahy Scale: Good   Single Leg Stance - Right Leg: 0 Single Leg Stance - Left Leg: 0 Tandem Stance - Right Leg: 3 Tandem Stance - Left Leg: 3 Rhomberg - Eyes Opened: 10       Standardized Balance Assessment Standardized Balance Assessment : Dynamic Gait Index   Dynamic Gait Index Level Surface: Mild Impairment Change in Gait Speed: Mild Impairment Gait with Horizontal Head Turns: Normal Gait with Vertical Head Turns: Normal Gait and Pivot Turn: Normal Step Over Obstacle: Normal Step Around Obstacles: Normal Steps: Mild Impairment Total Score: 21       Pertinent Vitals/Pain Pain Assessment Pain Assessment: No/denies pain    Home Living Family/patient expects to be discharged to:: Private residence Living Arrangements: Spouse/significant other  Available Help at Discharge: Family Type of Home: House Home Access: Stairs to enter Entrance Stairs-Rails: Psychiatric nurse of Steps: 7 (7 steps to  enter, then additional 7 steps)     Home Equipment: None      Prior Function Prior Level of Function : Independent/Modified Independent             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    Extremity/Trunk Assessment   Upper Extremity Assessment Upper Extremity Assessment: Overall WFL for tasks assessed    Lower Extremity Assessment Lower Extremity Assessment: Overall WFL for tasks assessed    Cervical / Trunk Assessment Cervical / Trunk Assessment: Normal  Communication   Communication: No difficulties  Cognition Arousal/Alertness: Awake/alert Behavior During Therapy: WFL for tasks assessed/performed Overall Cognitive Status: Within Functional Limits for tasks assessed                                          General Comments      Exercises     Assessment/Plan    PT Assessment Patient does not need any further PT services  PT Problem List         PT Treatment Interventions      PT Goals (Current goals can be found in the Care Plan section)  Acute Rehab PT Goals Patient Stated Goal: go home PT Goal Formulation: All assessment and education complete, DC therapy    Frequency       Co-evaluation               AM-PAC PT "6 Clicks" Mobility  Outcome Measure Help needed turning from your back to your side while in a flat bed without using bedrails?: None Help needed moving from lying on your back to sitting on the side of a flat bed without using bedrails?: None Help needed moving to and from a bed to a chair (including a wheelchair)?: None Help needed standing up from a chair using your arms (e.g., wheelchair or bedside chair)?: None Help needed to walk in hospital room?: None Help needed climbing 3-5 steps with a railing? : None 6 Click Score: 24    End of Session   Activity Tolerance: Patient tolerated treatment well Patient left: in bed;with call bell/phone  within reach Nurse Communication: Mobility status PT Visit Diagnosis: Unsteadiness on feet (R26.81)    Time: [id number] PT Time Calculation (min) (ACUTE ONLY): 14 min   Charges:   PT Evaluation $PT Eval Low Complexity: 1 Low          Wyona Almas, PT, DPT Acute Rehabilitation Services Office [phone]   Deno Etienne 05/13/2022, 10:03 AM

## 2022-05-13 NOTE — Progress Notes (Signed)
Echocardiogram 2D Echocardiogram has been performed.  Ricky Juarez 05/13/2022, 12:55 PM

## 2022-05-13 NOTE — Plan of Care (Signed)
  Problem: Education: Goal: Knowledge of disease or condition will improve Outcome: Progressing   Problem: Coping: Goal: Will verbalize positive feelings about self Outcome: Progressing Goal: Will identify appropriate support needs Outcome: Progressing   Problem: Health Behavior/Discharge Planning: Goal: Ability to manage health-related needs will improve Outcome: Progressing   Problem: Self-Care: Goal: Ability to participate in self-care as condition permits will improve Outcome: Progressing   Problem: Nutrition: Goal: Risk of aspiration will decrease Outcome: Progressing Goal: Dietary intake will improve Outcome: Progressing

## 2022-05-13 NOTE — ED Provider Notes (Signed)
Pt with right facial droop Already on DOAC Plan at signout is to f/u on CT angio and MRI Contact neuro after imaging complete   Ripley Fraise, MD 05/13/22 [phone]

## 2022-05-13 NOTE — Progress Notes (Addendum)
STROKE TEAM PROGRESS NOTE   INTERVAL HISTORY Patient is seen in his room with no family members at the bedside.  Yesterday, after doing some yard work, he felt as though he was leaning to the right while he walked.  He also had a slight right-sided facial droop.  His family noticed this and brought him to the emergency department.  He was found to have a small infarct in the left basal ganglia.  Patient takes Eliquis for atrial fibrillation and reports that he has not missed any doses of it recently.  Vitals:   05/13/22 0400 05/13/22 0506 05/13/22 0545 05/13/22 0755  BP: (!) 182/118 (!) 161/138  (!) 186/117  Pulse: 90 87  94  Resp: 18 18  20   Temp:  97.8 F (36.6 C)  97.7 F (36.5 C)  TempSrc:  Oral  Oral  SpO2: 95% 100%  97%  Height:   5\' 10"  (1.778 m)    CBC:  Recent Labs  Lab 05/12/22 1844  WBC 8.7  NEUTROABS 5.3  HGB 18.2*  HCT 55.0*  MCV 91.8  PLT 768   Basic Metabolic Panel:  Recent Labs  Lab 05/12/22 1844  NA 138  K 3.3*  CL 95*  CO2 30  GLUCOSE 85  BUN 18  CREATININE 1.11  CALCIUM 9.4   Lipid Panel:  Recent Labs  Lab 05/13/22 0605  CHOL 175  TRIG 49  HDL 54  CHOLHDL 3.2  VLDL 10  LDLCALC 111*   HgbA1c: No results for input(s): "HGBA1C" in the last 168 hours. Urine Drug Screen: No results for input(s): "LABOPIA", "COCAINSCRNUR", "LABBENZ", "AMPHETMU", "THCU", "LABBARB" in the last 168 hours.  Alcohol Level No results for input(s): "ETH" in the last 168 hours.  IMAGING past 24 hours DG Chest 2 View  Result Date: 05/13/2022 CLINICAL DATA:  77 year old male with history of acute cerebrovascular accident presenting with facial droop. EXAM: CHEST - 2 VIEW COMPARISON:  None available. FINDINGS: Lung volumes are normal. No consolidative airspace disease. No pleural effusions. No pneumothorax. No evidence of pulmonary edema. Heart size is mildly enlarged. The patient is rotated to the right on today's exam, resulting in distortion of the mediastinal contours  and reduced diagnostic sensitivity and specificity for mediastinal pathology. Atherosclerotic calcifications are noted in the thoracic aorta. IMPRESSION: 1. No radiographic evidence of acute cardiopulmonary disease. 2. Mild cardiomegaly. 3. Aortic atherosclerosis. Electronically Signed   By: Vinnie Langton M.D.   On: 05/13/2022 05:09   CT Angio Head Neck W WO CM  Result Date: 05/13/2022 CLINICAL DATA:  77 year old male presenting yesterday with right facial droop and weakness. MRA reveals patchy acute infarct in the left basal ganglia, corona radiata. EXAM: CT ANGIOGRAPHY HEAD AND NECK TECHNIQUE: Multidetector CT imaging of the head and neck was performed using the standard protocol during bolus administration of intravenous contrast. Multiplanar CT image reconstructions and MIPs were obtained to evaluate the vascular anatomy. Carotid stenosis measurements (when applicable) are obtained utilizing NASCET criteria, using the distal internal carotid diameter as the denominator. RADIATION DOSE REDUCTION: This exam was performed according to the departmental dose-optimization program which includes automated exposure control, adjustment of the mA and/or kV according to patient size and/or use of iterative reconstruction technique. CONTRAST:  25mL OMNIPAQUE IOHEXOL 350 MG/ML SOLN COMPARISON:  Brain MRI 0009 hours today.  Head CT yesterday. FINDINGS: CTA NECK Skeleton: Dental streak artifact affects detail of the mandible. Chronic C6-C7 ankylosis or arthrodesis. Superimposed anterolisthesis at the cervicothoracic junction with chronic facet arthropathy  and evidence of developing facet ankylosis there. Bulky adjacent segment disease at C5-C6. Bulky anterior endplate osteophytosis also at C4. No acute osseous abnormality identified. Upper chest: Mild dependent atelectasis. Negative visible superior mediastinum. Other neck: No acute finding. Mild left submandibular gland sialolithiasis. Aortic arch: Mildly tortuous  aortic arch and proximal great vessels. Mild to moderate calcified arch atherosclerosis. Right carotid system: Highly tortuous brachiocephalic artery although with little plaque. Patent right CCA origin without stenosis. No significant plaque before the right carotid bifurcation. Soft and calcified plaque at the posterior right ICA origin without stenosis. Distal calcified plaque beginning at the bulb and continuing distally but less than 50 % stenosis with respect to the distal vessel. Left carotid system: Mild calcified plaque at the left CCA origin without stenosis. Tortuous left CCA. Mild soft and calcified plaque at the left ICA origin and bulb without stenosis. Vertebral arteries: Calcified and tortuous right subclavian artery origin with less than 50 % stenosis with respect to the distal vessel. Calcified plaque at the right vertebral artery origin but only mild stenosis (series 9, image 162). Right vertebral artery patent to the skull base with no other significant plaque or stenosis. No significant proximal left subclavian artery plaque. Normal left vertebral artery origin. Tortuous left V1 segment. Mildly dominant left vertebral artery is patent to the skull base with no significant plaque or stenosis. CTA HEAD Posterior circulation: Dominant left V4 segment. No significant distal vertebral or vertebrobasilar junction plaque or stenosis. Normal PICA origins. Patent basilar artery without stenosis. Normal SCA and PCA origins. Mildly ectatic basilar tip but no discrete aneurysm. Right posterior communicating artery is present, the left is diminutive or absent. Left PCA branches are within normal limits. There is mild right P2 segment irregularity without significant stenosis (series 11, image 22). Anterior circulation: Both ICA siphons are patent. Mostly cavernous segment calcified plaque on the left. Noncalcified irregularity of the left ICA supraclinoid segment. No significant left siphon stenosis.  Contralateral right Saif appears mildly dominant. Mild right siphon soft and calcified plaque without significant stenosis. Ophthalmic artery and right posterior communicating artery origins are within normal limits. Patent carotid termini, MCA and ACA origins. Mildly dominant right A1. Normal anterior communicating artery. Bilateral ACA branches are within normal limits. Left MCA M1 segment and trifurcation are patent without stenosis. Left MCA branches appear within normal limits. Right MCA M1 segment and bifurcation are patent without stenosis. Right MCA branches are patent with mild M2 irregularity. No significant right MCA branch stenosis. Venous sinuses: Patent. Right transverse and sigmoid venous sinuses are dominant along with the right IJ. Anatomic variants: Mildly dominant left vertebral artery, right ICA siphon, right ACA A1. Dominant right transverse and sigmoid venous sinuses along with the right internal jugular vein. Review of the MIP images confirms the above findings IMPRESSION: 1. Negative for large vessel occlusion, and generally mild for age atherosclerosis in the head and neck. Scattered atherosclerosis, including conspicuous calcified plaque at the Right ICA bulb, but no significant arterial stenosis identified. 2. Advanced cervical spine degeneration superimposed on chronic C6-C7 ankylosis or postoperative arthrodesis, developing facet ankylosis at the cervicothoracic junction. 3. Mild left submandibular gland sialolithiasis. Electronically Signed   By: Genevie Ann M.D.   On: 05/13/2022 04:45   MR Brain Wo Contrast (neuro protocol)  Result Date: 05/13/2022 CLINICAL DATA:  Initial evaluation for neuro deficit, stroke. EXAM: MRI HEAD WITHOUT CONTRAST TECHNIQUE: Multiplanar, multiecho pulse sequences of the brain and surrounding structures were obtained without intravenous contrast. COMPARISON:  Prior  CT from 05/12/2022. FINDINGS: Brain: Generalized age-related cerebral atrophy. Patchy T2/FLAIR  hyperintensity involving the periventricular and deep white matter both cerebral hemispheres, consistent with chronic small vessel ischemic disease, moderate in nature. Patchy small volume restricted diffusion involving the left basal ganglia/corona radiata, consistent with acute ischemic infarct (series 5, image 76). No associated hemorrhage or mass effect. No other evidence for acute or subacute ischemia. Gray-white matter differentiation otherwise maintained. No areas of chronic cortical infarction. No acute intracranial hemorrhage. Single punctate chronic microhemorrhage noted within the right cerebellum. No mass lesion, midline shift or mass effect. No hydrocephalus or extra-axial fluid collection. Pituitary gland and suprasellar region within normal limits. Vascular: Major intracranial vascular flow voids are maintained. Skull and upper cervical spine: Craniocervical junction within normal limits. Bone marrow signal intensity normal. No scalp soft tissue abnormality. Sinuses/Orbits: Prior bilateral ocular lens replacement. Paranasal sinuses are largely clear. No mastoid effusion. Other: None. IMPRESSION: 1. 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 2. Underlying age-related cerebral atrophy with moderate chronic microvascular ischemic disease. Electronically Signed   By: Jeannine Boga M.D.   On: 05/13/2022 03:02   CT Head Wo Contrast  Result Date: 05/12/2022 CLINICAL DATA:  Right facial droop, weakness EXAM: CT HEAD WITHOUT CONTRAST TECHNIQUE: Contiguous axial images were obtained from the base of the skull through the vertex without intravenous contrast. RADIATION DOSE REDUCTION: This exam was performed according to the departmental dose-optimization program which includes automated exposure control, adjustment of the mA and/or kV according to patient size and/or use of iterative reconstruction technique. COMPARISON:  None Available. FINDINGS:  Brain: Normal anatomic configuration. Parenchymal volume loss is commensurate with the patient's age. Mild periventricular white matter changes are present likely reflecting the sequela of small vessel ischemia. No abnormal intra or extra-axial mass lesion or fluid collection. No abnormal mass effect or midline shift. No evidence of acute intracranial hemorrhage or infarct. Ventricular size is normal. Cerebellum unremarkable. Vascular: No asymmetric hyperdense vasculature at the skull base. Skull: Intact Sinuses/Orbits: Paranasal sinuses are clear. Orbits are unremarkable. Other: Mastoid air cells and middle ear cavities are clear. IMPRESSION: 1. No acute intracranial hemorrhage or infarct. 2. Mild senescent change. Electronically Signed   By: Fidela Salisbury M.D.   On: 05/12/2022 19:46    PHYSICAL EXAM General: Alert, well-nourished, well-developed patient in no acute distress Respiratory: Regular, unlabored respirations on room air  NEURO:  Mental Status: AA&Ox3  Speech/Language: speech is without dysarthria or aphasia.    Cranial Nerves:  II: PERRL.  III, IV, VI: EOMI. Eyelids elevate symmetrically.  V: Sensation is intact to light touch and symmetrical to face.  VII: Right-sided facial droop VIII: hearing intact to voice. IX, X: Phonation is normal.  [id number] shrug 5/5. XII: tongue is midline without fasciculations. Motor: 5/5 strength to all muscle groups tested.  Tone: is normal and bulk is normal Sensation- Intact to light touch bilaterally.  Coordination: FTN intact bilaterally.No drift.  Gait- deferred   ASSESSMENT/PLAN Mr. Ricky Juarez is a 77 y.o. male with history of atrial fibrillation on Eliquis, hypertension, diabetes, vitamin D deficiency, sleep apnea, BPH, essential tremor and hyperlipidemia presenting with leaning to the right while walking.  He also had a slight right-sided facial droop.  His family noticed this and brought him to the emergency department.  He was found  to have a small infarct in the left basal ganglia.  Patient takes Eliquis for atrial fibrillation and reports that he has not missed any doses of it  recently.  Stroke:  left basal ganglia infarct, likely small vessel disease although embolic in the setting of Afib on Eliquis can not be completely ruled out CT head No acute abnormality.  CTA head & neck no LVO or hemodynamically significant stenosis.  Right ICA bulb atherosclerosis MRI patchy small acute infarct in left basal ganglia, atrophy and small vessel ischemic changes 2D Echo EF 55 to 60% LDL 111 HgbA1c pending VTE prophylaxis -fully anticoagulated with Eliquis Eliquis (apixaban) daily prior to admission, now on aspirin 81 mg daily and Eliquis (apixaban) daily.  Continue on discharge. Therapy recommendations: None Disposition: Pending  Atrial fibrillation Patient has atrial fibrillation status post 2 attempts at ablation Continue full anticoagulation with Eliquis given no hemorrhagic transformation seen on MRI Continue home Cardizem and metoprolol  Hypertension urgency Home meds: Metoprolol 25 mg XR daily, torsemide 10 mg daily Stable on the high and On home metoprolol and Cardizem Long-term BP goal normotensive  Hyperlipidemia Home meds: Atorvastatin 10 mg daily LDL 111, goal < 70 Increase Lipitor to 40 Continue statin at discharge  Diabetes type II Controlled Home meds: Mounjaro 2.5 mg weekly HgbA1c pending goal < 7.0 CBG  SSI  Other Stroke Risk Factors Advanced Age >/= 66  Obstructive sleep apnea  Other Active Problems Essential tremor  Hospital day # 0  Seen by NP and then by MD, MD to edit note is needed  [place] , MSN, AGACNP-BC [facility] See Amion for schedule and pager information 05/13/2022 11:50 AM  ATTENDING NOTE: I reviewed above note and agree with the assessment and plan. Pt was seen and examined.   Wife and son at bedside.  Patient lying in bed, stated that he is  back to normal.  However, per son patient not 100% back to normal but close to baseline.  Still has slight right facial droop and dysarthria per son.  MRI showed left BG scattered small infarcts, etiology likely small vessel disease however embolic stroke from A-fib on Eliquis cannot be completely ruled out.  Continue home Eliquis, add aspirin 81 on top of Eliquis for further stroke prevention.  Increase Lipitor from 10-40.  Aggressive stroke risk factor modification.  PT/OT no recommendation.   For detailed assessment and plan, please refer to above/below as I have made changes wherever appropriate.   Neurology will sign off. Please call with questions. Pt will follow up with neurology at [facility] in about 4 weeks. Thanks for the consult.   Rosalin Hawking, MD PhD Stroke Neurology 05/13/2022 5:38 PM      To contact Stroke Continuity provider, please refer to [email or web address] After hours, contact General Neurology

## 2022-05-13 NOTE — ED Notes (Signed)
ED TO INPATIENT HANDOFF REPORT  ED Nurse Name and Phone #: [phone]  S Name/Age/Gender Ricky Juarez 77 y.o. male Room/Bed: 031C/031C  Code Status   Code Status: Full Code  Home/SNF/Other Home Patient oriented to: self, place, time, and situation Is this baseline? Yes   Triage Complete: Triage complete  Chief Complaint Acute CVA (cerebrovascular accident) [facility]) [I63.9]  Triage Note Patient here with complaint of facial droop and dysarthria that started yesterday per wife. Patient states he thinks the symptoms may be related to starting a beta blocker two weeks ago. No arm or leg drift, no sensation loss.   Allergies Allergies  Allergen Reactions   Penicillin G Anaphylaxis and Rash    Level of Care/Admitting Diagnosis ED Disposition     ED Disposition  Admit   Condition  --   Comment  Hospital Area: [facility] [[id number]]  Level of Care: Progressive [102]  Admit to Progressive based on following criteria: NEUROLOGICAL AND NEUROSURGICAL complex patients with significant risk of instability, who do not meet ICU criteria, yet require close observation or frequent assessment (< / = every 2 - 4 hours) with medical / nursing intervention.  May admit patient to [facility] or [facility] if equivalent level of care is available:: No  Covid Evaluation: Symptomatic Person Under Investigation (PUI) or recent exposure (last 10 days) *Testing Required*  Diagnosis: Acute CVA (cerebrovascular accident) [facility]) [id number]  Admitting Physician: Clance Boll [id number]  Attending Physician: Clance Boll [id number]  Certification:: I certify this patient will need inpatient services for at least 2 midnights  Estimated Length of Stay: 3          B Medical/Surgery History   A IV Location/Drains/Wounds Patient Lines/Drains/Airways Status     Active Line/Drains/Airways     Name Placement date Placement time Site Days   Peripheral IV 05/12/22 20 G 1"  Anterior;Distal;Right;Upper Arm 05/12/22  2029  Arm  1            Intake/Output Last 24 hours No intake or output data in the 24 hours ending 05/13/22 0406  Labs/Imaging Results for orders placed or performed during the hospital encounter of 05/12/22 (from the past 48 hour(s))  Basic metabolic panel     Status: Abnormal   Collection Time: 05/12/22  6:44 PM  Result Value Ref Range   Sodium 138 135 - 145 mmol/L   Potassium 3.3 (L) 3.5 - 5.1 mmol/L   Chloride 95 (L) 98 - 111 mmol/L   CO2 30 22 - 32 mmol/L   Glucose, Bld 85 70 - 99 mg/dL    Comment: Glucose reference range applies only to samples taken after fasting for at least 8 hours.   BUN 18 8 - 23 mg/dL   Creatinine, Ser 1.11 0.61 - 1.24 mg/dL   Calcium 9.4 8.9 - 10.3 mg/dL   GFR, Estimated >60 >60 mL/min    Comment: (NOTE) Calculated using the CKD-EPI Creatinine Equation (2021)    Anion gap 13 5 - 15    Comment: Performed at [facility] [address], [place], [place] [address]  CBC with Differential     Status: Abnormal   Collection Time: 05/12/22  6:44 PM  Result Value Ref Range   WBC 8.7 4.0 - 10.5 K/uL   RBC 5.99 (H) 4.22 - 5.81 MIL/uL   Hemoglobin 18.2 (H) 13.0 - 17.0 g/dL   HCT 55.0 (H) 39.0 - 52.0 %   MCV 91.8 80.0 - 100.0 fL  MCH 30.4 26.0 - 34.0 pg   MCHC 33.1 30.0 - 36.0 g/dL   RDW 12.4 11.5 - 15.5 %   Platelets 269 150 - 400 K/uL   nRBC 0.0 0.0 - 0.2 %   Neutrophils Relative % 61 %   Neutro Abs 5.3 1.7 - 7.7 K/uL   Lymphocytes Relative 24 %   Lymphs Abs 2.1 0.7 - 4.0 K/uL   Monocytes Relative 11 %   Monocytes Absolute 1.0 0.1 - 1.0 K/uL   Eosinophils Relative 3 %   Eosinophils Absolute 0.2 0.0 - 0.5 K/uL   Basophils Relative 1 %   Basophils Absolute 0.1 0.0 - 0.1 K/uL   Immature Granulocytes 0 %   Abs Immature Granulocytes 0.02 0.00 - 0.07 K/uL    Comment: Performed at [facility] [address] [place], [place] [address]  Troponin I (High Sensitivity)     Status: None    Collection Time: 05/12/22  6:44 PM  Result Value Ref Range   Troponin I (High Sensitivity) 14 <18 ng/L    Comment: (NOTE) Elevated high sensitivity troponin I (hsTnI) values and significant  changes across serial measurements may suggest ACS but many other  chronic and acute conditions are known to elevate hsTnI results.  Refer to the "Links" section for chest pain algorithms and additional  guidance. Performed at [facility], [place] [address] [place], [place] [address]   POC CBG, ED     Status: None   Collection Time: 05/12/22  7:26 PM  Result Value Ref Range   Glucose-Capillary 86 70 - 99 mg/dL    Comment: Glucose reference range applies only to samples taken after fasting for at least 8 hours.  Troponin I (High Sensitivity)     Status: None   Collection Time: 05/12/22  8:25 PM  Result Value Ref Range   Troponin I (High Sensitivity) 14 <18 ng/L    Comment: (NOTE) Elevated high sensitivity troponin I (hsTnI) values and significant  changes across serial measurements may suggest ACS but many other  chronic and acute conditions are known to elevate hsTnI results.  Refer to the "Links" section for chest pain algorithms and additional  guidance. Performed at [facility], [place] [address] [place], [place] [address]    MR Brain Wo Contrast (neuro protocol)  Result Date: 05/13/2022 CLINICAL DATA:  Initial evaluation for neuro deficit, stroke. EXAM: MRI HEAD WITHOUT CONTRAST TECHNIQUE: Multiplanar, multiecho pulse sequences of the brain and surrounding structures were obtained without intravenous contrast. COMPARISON:  Prior CT from 05/12/2022. FINDINGS: Brain: Generalized age-related cerebral atrophy. Patchy T2/FLAIR hyperintensity involving the periventricular and deep white matter both cerebral hemispheres, consistent with chronic small vessel ischemic disease, moderate in nature. Patchy small volume restricted diffusion involving the left basal ganglia/corona radiata,  consistent with acute ischemic infarct (series 5, image 76). No associated hemorrhage or mass effect. No other evidence for acute or subacute ischemia. Gray-white matter differentiation otherwise maintained. No areas of chronic cortical infarction. No acute intracranial hemorrhage. Single punctate chronic microhemorrhage noted within the right cerebellum. No mass lesion, midline shift or mass effect. No hydrocephalus or extra-axial fluid collection. Pituitary gland and suprasellar region within normal limits. Vascular: Major intracranial vascular flow voids are maintained. Skull and upper cervical spine: Craniocervical junction within normal limits. Bone marrow signal intensity normal. No scalp soft tissue abnormality. Sinuses/Orbits: Prior bilateral ocular lens replacement. Paranasal sinuses are largely clear. No mastoid effusion. Other: None. IMPRESSION: 1. Patchy small volume acute  ischemic nonhemorrhagic left basal ganglia/corona radiata infarct. 2. Underlying age-related cerebral atrophy with moderate chronic microvascular ischemic disease. Electronically Signed   By: Jeannine Boga M.D.   On: 05/13/2022 03:02   CT Head Wo Contrast  Result Date: 05/12/2022 CLINICAL DATA:  Right facial droop, weakness EXAM: CT HEAD WITHOUT CONTRAST TECHNIQUE: Contiguous axial images were obtained from the base of the skull through the vertex without intravenous contrast. RADIATION DOSE REDUCTION: This exam was performed according to the departmental dose-optimization program which includes automated exposure control, adjustment of the mA and/or kV according to patient size and/or use of iterative reconstruction technique. COMPARISON:  None Available. FINDINGS: Brain: Normal anatomic configuration. Parenchymal volume loss is commensurate with the patient's age. Mild periventricular white matter changes are present likely reflecting the sequela of small vessel ischemia. No abnormal intra or extra-axial mass lesion or  fluid collection. No abnormal mass effect or midline shift. No evidence of acute intracranial hemorrhage or infarct. Ventricular size is normal. Cerebellum unremarkable. Vascular: No asymmetric hyperdense vasculature at the skull base. Skull: Intact Sinuses/Orbits: Paranasal sinuses are clear. Orbits are unremarkable. Other: Mastoid air cells and middle ear cavities are clear. IMPRESSION: 1. No acute intracranial hemorrhage or infarct. 2. Mild senescent change. Electronically Signed   By: Fidela Salisbury M.D.   On: 05/12/2022 19:46    Pending Labs Unresulted Labs (From admission, onward)     Start     Ordered   05/13/22 0500  Lipid panel  (Labs)  Tomorrow morning,   R       Comments: Fasting    05/13/22 0358   05/13/22 0357  Hemoglobin A1c  (Labs)  Once,   R       Comments: To assess prior glycemic control    05/13/22 0358            Vitals/Pain Today's Vitals   05/13/22 0139 05/13/22 0139 05/13/22 0322 05/13/22 0330  BP:   (!) 188/126 (!) 182/94  Pulse:   87 86  Resp:   18 19  Temp:  98 F (36.7 C)    TempSrc:  Oral    SpO2:   95% 96%  PainSc: 0-No pain       Isolation Precautions No active isolations  Medications Medications  atorvastatin (LIPITOR) tablet 10 mg (has no administration in time range)  torsemide (DEMADEX) tablet 10 mg (has no administration in time range)  apixaban (ELIQUIS) tablet 5 mg (has no administration in time range)   stroke: early stages of recovery book (has no administration in time range)  0.9 %  sodium chloride infusion (has no administration in time range)  acetaminophen (TYLENOL) tablet 650 mg (has no administration in time range)    Or  acetaminophen (TYLENOL) 160 MG/5ML solution 650 mg (has no administration in time range)    Or  acetaminophen (TYLENOL) suppository 650 mg (has no administration in time range)  senna-docusate (Senokot-S) tablet 1 tablet (has no administration in time range)    Mobility walks with person assist      Focused Assessments    R Recommendations: See Admitting Provider Note  Report given to:   Additional Notes:

## 2023-11-09 ENCOUNTER — Other Ambulatory Visit (HOSPITAL_COMMUNITY): Payer: Self-pay

## 2023-12-09 ENCOUNTER — Ambulatory Visit

## 2023-12-09 ENCOUNTER — Other Ambulatory Visit: Payer: Self-pay | Admitting: Chiropractor

## 2023-12-09 DIAGNOSIS — M4722 Other spondylosis with radiculopathy, cervical region: Secondary | ICD-10-CM
